# Patient Record
Sex: Male | Born: 1995 | Race: White | Hispanic: No | Marital: Single | Smoking: Former smoker
Health system: Southern US, Community
[De-identification: ages and names within clinical notes are randomized; demographics above are authoritative.]

## PROBLEM LIST (undated history)

## (undated) DIAGNOSIS — I2510 Atherosclerotic heart disease of native coronary artery without angina pectoris: Secondary | ICD-10-CM

## (undated) DIAGNOSIS — Z87891 Personal history of nicotine dependence: Secondary | ICD-10-CM

## (undated) DIAGNOSIS — Z9289 Personal history of other medical treatment: Secondary | ICD-10-CM

## (undated) DIAGNOSIS — E785 Hyperlipidemia, unspecified: Secondary | ICD-10-CM

## (undated) HISTORY — DX: Personal history of nicotine dependence: Z87.891

## (undated) HISTORY — DX: Hyperlipidemia, unspecified: E78.5

## (undated) HISTORY — DX: Personal history of other medical treatment: Z92.89

## (undated) HISTORY — DX: Atherosclerotic heart disease of native coronary artery without angina pectoris: I25.10

---
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
## 2015-12-05 ENCOUNTER — Emergency Department
Admission: EM | Admit: 2015-12-05 | Discharge: 2015-12-05 | Disposition: A | Discharge status: Home / Self Care | Payer: Self-pay | Attending: Emergency Medicine | Admitting: Emergency Medicine

## 2015-12-05 ENCOUNTER — Encounter: Payer: Self-pay | Admitting: Emergency Medicine

## 2015-12-05 DIAGNOSIS — K047 Periapical abscess without sinus: Secondary | ICD-10-CM | POA: Insufficient documentation

## 2015-12-05 DIAGNOSIS — F17210 Nicotine dependence, cigarettes, uncomplicated: Secondary | ICD-10-CM | POA: Insufficient documentation

## 2015-12-05 MED ORDER — HYDROCODONE-ACETAMINOPHEN 5-325 MG PO TABS: 1.0000 | ORAL_TABLET | ORAL | Status: DC | PRN

## 2015-12-05 MED ORDER — AMOXICILLIN 500 MG PO TABS: 500.0000 mg | ORAL_TABLET | Freq: Two times a day (BID) | ORAL | Status: DC

## 2015-12-05 MED ORDER — NAPROXEN 500 MG PO TABS: 500.0000 mg | ORAL_TABLET | Freq: Two times a day (BID) | ORAL | Status: DC

## 2015-12-05 NOTE — Discharge Instructions (Signed)
OPTIONS FOR DENTAL FOLLOW UP CARE ° °[place] Department of Health and Human Services - Local Safety Net Dental Clinics °http://www.ncdhhs.gov/dph/oralhealth/services/safetynetclinics.htm °  °[facility] (336-562-3123) ° °[facility] [place] (919-933-9087) ° °[facility] (919-663-1744 ext [phone]) ° °[facility] ([phone]) ° °[facility] ([phone]) °This clinic caters to the indigent population and is on a lottery system. °Location: °[facility], [address], [place] °Clinic Hours: °Wednesdays from 6pm - 9pm, patients seen by a lottery system. °For dates, call or go to [email or web address].[facility].edu/[facility]/patients/Dental-[facility] °Services: °Cleanings, fillings and simple extractions. °Payment Options: °DENTAL WORK IS FREE OF CHARGE. Bring proof of income or support. °Best way to get seen: °Arrive at 5:15 pm - this is a lottery, NOT first come/first serve, so arriving earlier will not increase your chances of being seen. °  °  °[facility] °[phone] °Select option 1 for emergencies °  °Location: °[facility], [address], [place] °Clinic Hours: °No walk-ins accepted - call the day before to schedule an appointment. °Check in times are 9:30 am and 1:30 pm. °Services: °Simple extractions, temporary fillings, pulpectomy/pulp debridement, uncomplicated abscess drainage. °Payment Options: °PAYMENT IS DUE AT THE TIME OF SERVICE.  Fee is usually $100-200, additional surgical procedures (e.g. abscess drainage) may be extra. °Cash, checks, Visa/MasterCard accepted.  Can file Medicaid if patient is covered for dental - patient should call case worker to check. °No discount for [facility] patients. °Best way to get seen: °MUST call the day before and get onto the schedule. Can usually be seen the next 1-2 days. No walk-ins accepted. °  °  °[facility] °[phone] °   °Location: °[facility], [address], [place] °Clinic Hours: °M, W, Th, F 8am or 1:30pm, Tues 9a or 1:30 - first come/first served. °Services: °Simple extractions, temporary fillings, uncomplicated abscess drainage.  You do not need to be an [place] resident. °Payment Options: °PAYMENT IS DUE AT THE TIME OF SERVICE. °Dental insurance, otherwise sliding scale - bring proof of income or support. °Depending on income and treatment needed, cost is usually $50-200. °Best way to get seen: °Arrive early as it is first come/first served. °  °  °[facility] °[phone] °  °Location: °[address] °Clinic Hours: °Mon-Thu 8a-5p °Services: °Most basic dental services including extractions and fillings. °Payment Options: °PAYMENT IS DUE AT THE TIME OF SERVICE. °Sliding scale, up to 50% off - bring proof if income or support. °Medicaid with dental option accepted. °Best way to get seen: °Call to schedule an appointment, can usually be seen within 2 weeks OR they will try to see walk-ins - show up at 8a or 2p (you may have to wait). °  °  °[facility] °[phone] °[place] RESIDENTS ONLY °  °Location: °[facility], [address], [place],  [address] °Clinic Hours: By appointment only. °Monday - Thursday 8am-5pm, Friday 8am-12pm °Services: Cleanings, fillings, extractions. °Payment Options: °PAYMENT IS DUE AT THE TIME OF SERVICE. °Cash, Visa or MasterCard. Sliding scale - $30 minimum per service. °Best way to get seen: °Come in to office, complete packet and make an appointment - need proof of income °or support monies for each household member and proof of [place] residence. °Usually takes about a month to get in. °  °  °[facility] °[phone] °  °Location: °[address]   [place] °Clinic Hours: Walk-in Urgent Care Dental Services are offered Monday-Friday  mornings only. °The numbers of emergencies accepted daily is limited to the number of °providers available. °Maximum 15 - Mondays, Wednesdays & Thursdays °Maximum 10 - Tuesdays & Fridays °Services: °You do not need to be a [place] resident to be seen for a dental emergency. °Emergencies are defined as pain, swelling, abnormal bleeding, or dental trauma. Walkins will receive x-rays if needed. °NOTE: Dental cleaning is not an emergency. °Payment Options: °PAYMENT IS DUE AT THE TIME OF SERVICE. °Minimum co-pay is $40.00 for uninsured patients. °Minimum co-pay is $3.00 for Medicaid with dental coverage. °Dental Insurance is accepted and must be presented at time of visit. °Medicare does not cover dental. °Forms of payment: Cash, credit card, checks. °Best way to get seen: °If not previously registered with the clinic, walk-in dental registration begins at 7:15 am and is on a first come/first serve basis. °If previously registered with the clinic, call to make an appointment. °  °  °[facility] °[phone] °[place] RESIDENTS ONLY °  °Location: °[address], [place], [place] °Clinic Hours: °Mon-Thu 10a-2p °Services: Extractions only! °Payment Options: °FREE (donations accepted) - bring proof of income or support °Best way to get seen: °Call and schedule an appointment OR come at 8am on the 1st Monday of every month (except for holidays) when it is first come/first served. °  °  °[facility] °[phone] °  °Location: °[address], [place] °Clinic Hours: °Friday mornings °Services, Payment Options, Best way to get seen: °Call for info ° ° °Dental Abscess °A dental abscess is a collection of pus in or around a tooth. °CAUSES °This condition is caused by a bacterial infection around the root of the tooth that involves the inner part of the tooth (pulp). It may result from: °· Severe tooth decay. °· Trauma to the tooth that allows bacteria to enter into the pulp, such as a broken or chipped  tooth. °· Severe gum disease around a tooth. °SYMPTOMS °Symptoms of this condition include: °· Severe pain in and around the infected tooth. °· Swelling and redness around the infected tooth, in the mouth, or in the face. °· Tenderness. °· Pus drainage. °· Bad breath. °· Bitter taste in the mouth. °· Difficulty swallowing. °· Difficulty opening the mouth. °· Nausea. °· Vomiting. °· Chills. °· Swollen neck glands. °· Fever. °DIAGNOSIS °This condition is diagnosed with examination of the infected tooth. During the exam, your dentist may tap on the infected tooth. Your dentist will also ask about your medical and dental history and may order X-rays. °TREATMENT °This condition is treated by eliminating the infection. This may be done with: °· Antibiotic medicine. °· A root canal. This may be performed to save the tooth. °· Pulling (extracting) the tooth. This may also involve draining the abscess. This is done if the tooth cannot be saved. °HOME CARE INSTRUCTIONS °· Take medicines only as directed by your dentist. °· If you were prescribed antibiotic medicine, finish all of it even if you start to feel better. °· Rinse your mouth (gargle) often with salt water to relieve pain or swelling. °· Do not drive or operate heavy machinery while taking pain medicine. °· Do not apply heat to the outside of your mouth. °· Keep all follow-up visits as directed by your dentist. This is important. °SEEK MEDICAL CARE IF: °· Your pain is worse and is not helped by medicine. °SEEK IMMEDIATE MEDICAL CARE IF: °·   You have a fever or chills. °· Your symptoms suddenly get worse. °· You have a very bad headache. °· You have problems breathing or swallowing. °· You have trouble opening your mouth. °· You have swelling in your neck or around your eye. °  °This information is not intended to replace advice given to you by your health care provider. Make sure you discuss any questions you have with your health care provider. °  °Document  Released: 06/18/2005 Document Revised: 11/02/2014 Document Reviewed: 06/15/2014 °Elsevier Interactive Patient Education ©2016 Elsevier Inc. ° °

## 2015-12-05 NOTE — ED Notes (Signed)
Dental pain L side of mouth, unsure if it is upper or lower, since last night.

## 2015-12-05 NOTE — ED Provider Notes (Signed)
[facility] Emergency Department Provider Note  ____________________________________________  Time seen: Approximately 10:54 AM  I have reviewed the triage vital signs and the nursing notes.   HISTORY  Chief Complaint Dental Pain    HPI Walter Duncan is a 20 y.o. male who presents for evaluation of left side of the mouth pain times a couple days. Denies any trauma. Positive swelling. Patient complains of dental pain describes as a 9/10 nonradiating. No relief with over-the-counter medications.   History reviewed. No pertinent past medical history.  There are no active problems to display for this patient.   History reviewed. No pertinent past surgical history.  Current Outpatient Rx  Name  Route  Sig  Dispense  Refill  . amoxicillin (AMOXIL) 500 MG tablet   Oral   Take 1 tablet (500 mg total) by mouth 2 (two) times daily.   20 tablet   0   . HYDROcodone-acetaminophen (NORCO) 5-325 MG tablet   Oral   Take 1-2 tablets by mouth every 4 (four) hours as needed for moderate pain.   15 tablet   0   . naproxen (NAPROSYN) 500 MG tablet   Oral   Take 1 tablet (500 mg total) by mouth 2 (two) times daily with a meal.   60 tablet   0     Allergies Review of patient's allergies indicates no known allergies.  No family history on file.  Social History Social History  Substance Use Topics  . Smoking status: Current Every Day Smoker -- 0.50 packs/day    Types: Cigarettes  . Smokeless tobacco: None  . Alcohol Use: No    Review of Systems Constitutional: No fever/chills Eyes: No visual changes. ENT: Positive dental caries noted both posterior lower left and upper left. Positive erythema and swelling noted to the gums. Cardiovascular: Denies chest pain. Respiratory: Denies shortness of breath. Musculoskeletal: Negative for back pain. Skin: Negative for rash. Neurological: Negative for headaches, focal weakness or numbness.  10-point ROS  otherwise negative.  ____________________________________________   PHYSICAL EXAM:  VITAL SIGNS: ED Triage Vitals  Enc Vitals Group     BP 12/05/15 1008 107/76 mmHg     Pulse Rate 12/05/15 1008 66     Resp 12/05/15 1008 18     Temp 12/05/15 1008 98.3 F (36.8 C)     Temp Source 12/05/15 1008 Oral     SpO2 12/05/15 1008 97 %     Weight 12/05/15 1008 126 lb (57.153 kg)     Height 12/05/15 1008 5\' 5"  (1.651 m)     Head Cir --      Peak Flow --      Pain Score 12/05/15 1010 9     Pain Loc --      Pain Edu? --      Excl. in GC? --     Constitutional: Alert and oriented. Well appearing and in no acute distress. Mouth/Throat: Mucous membranes are moist.  Oropharynx non-erythematous.Obvious dental caries left upper and lower teeth. Gums are erythematous Neck: No stridor.   Cardiovascular: Normal rate, regular rhythm. Grossly normal heart sounds.  Good peripheral circulation. Respiratory: Normal respiratory effort.  No retractions. Lungs CTAB. Musculoskeletal: No lower extremity tenderness nor edema.  No joint effusions. Neurologic:  Normal speech and language. No gross focal neurologic deficits are appreciated. No gait instability. Skin:  Skin is warm, dry and intact. No rash noted. Psychiatric: Mood and affect are normal. Speech and behavior are normal.  ____________________________________________  LABS (all labs ordered are listed, but only abnormal results are displayed)  Labs Reviewed - No data to display ____________________________________________    PROCEDURES  Procedure(s) performed: None  Critical Care performed: No  ____________________________________________   INITIAL IMPRESSION / ASSESSMENT AND PLAN / ED COURSE  Pertinent labs & imaging results that were available during my care of the patient were reviewed by me and considered in my medical decision making (see chart for details).  Acute dental abscess. Rx given for amoxicillin 500 mg 3 times a  day, Naprosyn 500 mg twice a day and Norco 5/325. Patient follow-up with list attached dental providers. He voices no other emergency medical complaints at this time. ____________________________________________   FINAL CLINICAL IMPRESSION(S) / ED DIAGNOSES  Final diagnoses:  Dental abscess     This chart was dictated using voice recognition software/Dragon. Despite best efforts to proofread, errors can occur which can change the meaning. Any change was purely unintentional.   Evangeline Dakin, PA-C 12/05/15 1606  Jene Every, MD 12/06/15 1253

## 2015-12-05 NOTE — ED Notes (Signed)
States he developed some pain to left side of mouth couple of days ago.denies any trauma   No swelling noted   Min relief with ibu

## 2015-12-11 ENCOUNTER — Emergency Department
Admission: EM | Admit: 2015-12-11 | Discharge: 2015-12-11 | Disposition: A | Discharge status: Home / Self Care | Payer: Self-pay | Attending: Emergency Medicine | Admitting: Emergency Medicine

## 2015-12-11 ENCOUNTER — Encounter: Payer: Self-pay | Admitting: Emergency Medicine

## 2015-12-11 DIAGNOSIS — F17210 Nicotine dependence, cigarettes, uncomplicated: Secondary | ICD-10-CM | POA: Insufficient documentation

## 2015-12-11 DIAGNOSIS — B0230 Zoster ocular disease, unspecified: Secondary | ICD-10-CM | POA: Insufficient documentation

## 2015-12-11 MED ORDER — HYDROCODONE-ACETAMINOPHEN 5-325 MG PO TABS
1.0000 | ORAL_TABLET | ORAL | Status: AC
  Administered 2015-12-11: 1 via ORAL
  Filled 2015-12-11: qty 1

## 2015-12-11 MED ORDER — TETRACAINE HCL 0.5 % OP SOLN
1.0000 [drp] | Freq: Once | OPHTHALMIC | Status: AC
  Administered 2015-12-11: 1 [drp] via OPHTHALMIC
  Filled 2015-12-11: qty 2

## 2015-12-11 MED ORDER — HYDROCODONE-ACETAMINOPHEN 5-325 MG PO TABS: 1.0000 | ORAL_TABLET | Freq: Four times a day (QID) | ORAL | Status: DC | PRN

## 2015-12-11 MED ORDER — ACYCLOVIR 400 MG PO TABS: 800.0000 mg | ORAL_TABLET | Freq: Every day | ORAL | Status: DC

## 2015-12-11 MED ORDER — ACYCLOVIR 200 MG PO CAPS
800.0000 mg | ORAL_CAPSULE | Freq: Every day | ORAL | Status: DC
Start: 2015-12-11 — End: 2015-12-12
  Administered 2015-12-11: 800 mg via ORAL
  Filled 2015-12-11 (×2): qty 4

## 2015-12-11 MED ORDER — FLUORESCEIN SODIUM 1 MG OP STRP
1.0000 | ORAL_STRIP | Freq: Once | OPHTHALMIC | Status: AC
  Administered 2015-12-11: 1 via OPHTHALMIC
  Filled 2015-12-11: qty 1

## 2015-12-11 NOTE — Discharge Instructions (Signed)
Shingles Shingles is an infection that causes a painful skin rash and fluid-filled blisters. Shingles is caused by the same virus that causes chickenpox. Shingles only develops in people who:  Have had chickenpox.  Have gotten the chickenpox vaccine. (This is rare.) The first symptoms of shingles may be itching, tingling, or pain in an area on your skin. A rash will follow in a few days or weeks. The rash is usually on one side of the body in a bandlike or beltlike pattern. Over time, the rash turns into fluid-filled blisters that break open, scab over, and dry up. Medicines may:  Help you manage pain.  Help you recover more quickly.  Help to prevent long-term problems. HOME CARE Medicines  Take medicines only as told by your doctor.  Apply an anti-itch or numbing cream to the affected area as told by your doctor. Blister and Rash Care  Take a cool bath or put cool compresses on the area of the rash or blisters as told by your doctor. This may help with pain and itching.  Keep your rash covered with a loose bandage (dressing). Wear loose-fitting clothing.  Keep your rash and blisters clean with mild soap and cool water or as told by your doctor.  Check your rash every day for signs of infection. These include redness, swelling, and pain that lasts or gets worse.  Do not pick your blisters.  Do not scratch your rash. General Instructions  Rest as told by your doctor.  Keep all follow-up visits as told by your doctor. This is important.  Until your blisters scab over, your infection can cause chickenpox in people who have never had it or been vaccinated against it. To prevent this from happening, avoid touching other people or being around other people, especially:  Babies.  Pregnant women.  Children who have eczema.  Elderly people who have transplants.  People who have chronic illnesses, such as leukemia or AIDS. GET HELP IF:  Your pain does not get better with  medicine.  Your pain does not get better after the rash heals.  Your rash looks infected. Signs of infection include:  Redness.  Swelling.  Pain that lasts or gets worse. GET HELP RIGHT AWAY IF:  The rash is on your face or nose.  You have pain in your face, pain around your eye area, or loss of feeling on one side of your face.  You have ear pain or you have ringing in your ear.  You have loss of taste.  Your condition gets worse.   This information is not intended to replace advice given to you by your health care provider. Make sure you discuss any questions you have with your health care provider.   Document Released: 12/05/2007 Document Revised: 07/09/2014 Document Reviewed: 03/30/2014 Elsevier Interactive Patient Education 2016 Elsevier Inc.   Please call Dr. Skip EstimableBrasington's office first thing tomorrow morning. Dr. Inez PilgrimBrasington will need to see you in his office tomorrow morning to examine your eye.

## 2015-12-11 NOTE — ED Notes (Signed)
Believes he got poison oak around his rt eye - tried putting cream around the eye with no relief - now eye red as well

## 2015-12-11 NOTE — ED Provider Notes (Signed)
CSN: [id number]     Arrival date & time 12/11/15  2049 History   First MD Initiated Contact with Patient 12/11/15 2130     Chief Complaint  Patient presents with  . Eye Pain     (Consider location/radiation/quality/duration/timing/severity/associated sxs/prior Treatment) HPI  20 year old male presents of her [place] for evaluation of right eye pain with rash. Symptoms have been present for 4 days. He developed papular painful rash along the right side of his face above the right eye. Today he developed blurred vision with photophobia that is mild. His pain is 10 out of 10. His employer told him that he had developed a contact dermatitis, likely poison ivy, topical Benadryl has not provided him with any relief. He has not been taking any medications for pain. He denies any other rash to his body. No fevers or neck pain.  History reviewed. No pertinent past medical history. History reviewed. No pertinent past surgical history. History reviewed. No pertinent family history. Social History  Substance Use Topics  . Smoking status: Current Every Day Smoker -- 0.50 packs/day    Types: Cigarettes  . Smokeless tobacco: None  . Alcohol Use: No    Review of Systems  Constitutional: Negative.  Negative for fever, chills, activity change and appetite change.  HENT: Negative for congestion, ear pain, mouth sores, rhinorrhea, sinus pressure, sore throat and trouble swallowing.   Eyes: Positive for photophobia, pain and redness. Negative for discharge.  Respiratory: Negative for cough, chest tightness and shortness of breath.   Cardiovascular: Negative for chest pain and leg swelling.  Gastrointestinal: Negative for nausea, vomiting, abdominal pain, diarrhea and abdominal distention.  Genitourinary: Negative for dysuria and difficulty urinating.  Musculoskeletal: Negative for back pain, arthralgias and gait problem.  Skin: Positive for rash. Negative for color change.  Neurological: Negative for  dizziness and headaches.  Hematological: Negative for adenopathy.  Psychiatric/Behavioral: Negative for behavioral problems and agitation.      Allergies  Review of patient's allergies indicates no known allergies.  Home Medications   Prior to Admission medications   Medication Sig Start Date End Date Taking? Authorizing Provider  acyclovir (ZOVIRAX) 400 MG tablet Take 2 tablets (800 mg total) by mouth 5 (five) times daily. X 10 days 12/11/15   Evon Slackhomas C Gaines, PA-C  amoxicillin (AMOXIL) 500 MG tablet Take 1 tablet (500 mg total) by mouth 2 (two) times daily. 12/05/15   Evangeline Dakinharles M Beers, PA-C  HYDROcodone-acetaminophen (NORCO) 5-325 MG tablet Take 1 tablet by mouth every 6 (six) hours as needed for moderate pain. 12/11/15   Evon Slackhomas C Gaines, PA-C  naproxen (NAPROSYN) 500 MG tablet Take 1 tablet (500 mg total) by mouth 2 (two) times daily with a meal. 12/05/15   Charmayne Sheerharles M Beers, PA-C   BP 112/68 mmHg  Pulse 98  Temp(Src) 98.5 F (36.9 C) (Oral)  Resp 20  Ht 5\' 5"  (1.651 m)  Wt 57.607 kg  BMI 21.13 kg/m2  SpO2 98% Physical Exam  Constitutional: He is oriented to person, place, and time. He appears well-developed and well-nourished.  HENT:  Head: Normocephalic and atraumatic.  Eyes: EOM and lids are normal. Pupils are equal, round, and reactive to light. Lids are everted and swept, no foreign bodies found. Right conjunctiva is injected.  Slit lamp exam:      The right eye shows no fluorescein uptake and no anterior chamber bulge.  Examination of the right face shows patient has a macular papular rash in the ophthalmic nerve distribution that does  not cross the midline. There is mild right eye conjunctival erythema with no drainage. There is no eyelid droop. Extraocular movement is normal.  Neck: Normal range of motion. Neck supple.  Cardiovascular: Normal rate, regular rhythm, normal heart sounds and intact distal pulses.   Pulmonary/Chest: Effort normal and breath sounds normal. No  respiratory distress. He has no wheezes. He has no rales. He exhibits no tenderness.  Abdominal: Soft. Bowel sounds are normal. He exhibits no distension. There is no tenderness.  Musculoskeletal: Normal range of motion. He exhibits no edema or tenderness.  Neurological: He is alert and oriented to person, place, and time.  Skin: Skin is warm and dry.  Psychiatric: He has a normal mood and affect. His behavior is normal. Judgment and thought content normal.    ED Course  Procedures (including critical care time) Labs Review Labs Reviewed - No data to display  Imaging Review No results found. I have personally reviewed and evaluated these images and lab results as part of my medical decision-making.   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 Visual acuity 20/20 both eyes. Discussed case with Dr. Inez Pilgrim. Patient will be started on oral Acyclovir   5 times daily for 10 days. He is going to follow-up with Dr. Inez Pilgrim first thing in the morning for evaluation. Patient return to the ER for any worsening symptoms or urgent changes in his health.    Evon Slack, PA-C 12/11/15 2218  Evon Slack, PA-C 12/11/15 2219  Jene Every, MD 12/11/15 [phone]

## 2016-09-25 ENCOUNTER — Encounter (HOSPITAL_COMMUNITY): Payer: Self-pay | Admitting: *Deleted

## 2016-09-25 ENCOUNTER — Ambulatory Visit (HOSPITAL_COMMUNITY)
Admission: EM | Admit: 2016-09-25 | Discharge: 2016-09-25 | Disposition: A | Discharge status: Home / Self Care | Payer: Self-pay | Attending: Internal Medicine | Admitting: Internal Medicine

## 2016-09-25 DIAGNOSIS — K529 Noninfective gastroenteritis and colitis, unspecified: Secondary | ICD-10-CM

## 2016-09-25 MED ORDER — ONDANSETRON 8 MG PO TBDP: 8.0000 mg | ORAL_TABLET | Freq: Three times a day (TID) | ORAL | 0 refills | Status: DC | PRN

## 2016-09-25 NOTE — ED Triage Notes (Signed)
Vomiting    And   Diarrhea   Started  Last  Pm      denys  Any  Other  Symptoms

## 2016-09-25 NOTE — ED Provider Notes (Signed)
CSN: [id number]     Arrival date & time 09/25/16  1141 History   None    Chief Complaint  Patient presents with  . Emesis   (Consider location/radiation/quality/duration/timing/severity/associated sxs/prior Treatment) Patient c/o NVD since last night.   The history is provided by the patient.  Emesis  Severity:  Moderate Duration:  2 days Timing:  Constant Able to tolerate:  Liquids Progression:  Worsening Chronicity:  New Relieved by:  Nothing Worsened by:  Nothing Ineffective treatments:  None tried   History reviewed. No pertinent past medical history. History reviewed. No pertinent surgical history. History reviewed. No pertinent family history. Social History  Substance Use Topics  . Smoking status: Current Every Day Smoker    Packs/day: 0.50    Types: Cigarettes  . Smokeless tobacco: Not on file  . Alcohol use No    Review of Systems  Constitutional: Positive for fatigue.  HENT: Negative.   Eyes: Negative.   Respiratory: Negative.   Cardiovascular: Negative.   Gastrointestinal: Positive for vomiting.  Endocrine: Negative.   Genitourinary: Negative.   Musculoskeletal: Negative.   Skin: Negative.   Allergic/Immunologic: Negative.   Neurological: Negative.   Hematological: Negative.   Psychiatric/Behavioral: Negative.     Allergies  Patient has no known allergies.  Home Medications   Prior to Admission medications   Medication Sig Start Date End Date Taking? Authorizing Provider  acyclovir (ZOVIRAX) 400 MG tablet Take 2 tablets (800 mg total) by mouth 5 (five) times daily. X 10 days 12/11/15   Evon Slack, PA-C  amoxicillin (AMOXIL) 500 MG tablet Take 1 tablet (500 mg total) by mouth 2 (two) times daily. 12/05/15   Evangeline Dakin, PA-C  HYDROcodone-acetaminophen (NORCO) 5-325 MG tablet Take 1 tablet by mouth every 6 (six) hours as needed for moderate pain. 12/11/15   Evon Slack, PA-C  naproxen (NAPROSYN) 500 MG tablet Take 1 tablet (500 mg  total) by mouth 2 (two) times daily with a meal. 12/05/15   Charmayne Sheer Beers, PA-C  ondansetron (ZOFRAN ODT) 8 MG disintegrating tablet Take 1 tablet (8 mg total) by mouth every 8 (eight) hours as needed for nausea or vomiting. 09/25/16   Deatra Canter, FNP   Meds Ordered and Administered this Visit  Medications - No data to display  BP 121/69 (BP Location: Right Arm)   Pulse 78   Temp 98.4 F (36.9 C)   Resp 18   SpO2 100%  No data found.   Physical Exam  Constitutional: He appears well-developed and well-nourished.  HENT:  Head: Normocephalic and atraumatic.  Eyes: Conjunctivae and EOM are normal. Pupils are equal, round, and reactive to light.  Neck: Normal range of motion. Neck supple.  Cardiovascular: Normal rate, regular rhythm and normal heart sounds.   Pulmonary/Chest: Effort normal and breath sounds normal.  Abdominal: Soft. Bowel sounds are normal.  Nursing note and vitals reviewed.   Urgent Care Course     Procedures (including critical care time)  Labs Review Labs Reviewed - No data to display  Imaging Review No results found.   Visual Acuity Review  Right Eye Distance:   Left Eye Distance:   Bilateral Distance:    Right Eye Near:   Left Eye Near:    Bilateral Near:         MDM   1. Gastroenteritis    Zofran ODT 8mg  one po tid prn #21  Push po fluids, rest, tylenol and motrin otc prn as directed for fever,  arthralgias, and myalgias.  Follow up prn if sx's continue or persist.    Deatra CanterWilliam J Oxford, FNP 09/25/16 1334

## 2018-05-19 ENCOUNTER — Encounter: Payer: Self-pay | Admitting: Emergency Medicine

## 2018-05-19 ENCOUNTER — Emergency Department: Payer: PRIVATE HEALTH INSURANCE

## 2018-05-19 ENCOUNTER — Emergency Department
Admission: EM | Admit: 2018-05-19 | Discharge: 2018-05-19 | Disposition: A | Discharge status: Home / Self Care | Payer: PRIVATE HEALTH INSURANCE | Attending: Emergency Medicine | Admitting: Emergency Medicine

## 2018-05-19 DIAGNOSIS — F17210 Nicotine dependence, cigarettes, uncomplicated: Secondary | ICD-10-CM | POA: Insufficient documentation

## 2018-05-19 DIAGNOSIS — Y93H2 Activity, gardening and landscaping: Secondary | ICD-10-CM | POA: Insufficient documentation

## 2018-05-19 DIAGNOSIS — S99922A Unspecified injury of left foot, initial encounter: Secondary | ICD-10-CM | POA: Diagnosis not present

## 2018-05-19 DIAGNOSIS — Y929 Unspecified place or not applicable: Secondary | ICD-10-CM | POA: Insufficient documentation

## 2018-05-19 DIAGNOSIS — Y998 Other external cause status: Secondary | ICD-10-CM | POA: Diagnosis not present

## 2018-05-19 MED ORDER — IBUPROFEN 600 MG PO TABS: 600.0000 mg | ORAL_TABLET | Freq: Four times a day (QID) | ORAL | 0 refills | Status: DC | PRN
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
## 2018-05-19 NOTE — ED Triage Notes (Signed)
Spoke with Walter Duncan [phone], required UDS for WC
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
## 2019-04-18 IMAGING — DX DG FOOT COMPLETE 3+V*L*
3 series · 3 of 3 positions shown · non-contrast
Comparison: None.

CLINICAL DATA: Left foot pain

EXAM:
LEFT FOOT - COMPLETE 3+ VIEW

[foot ap]
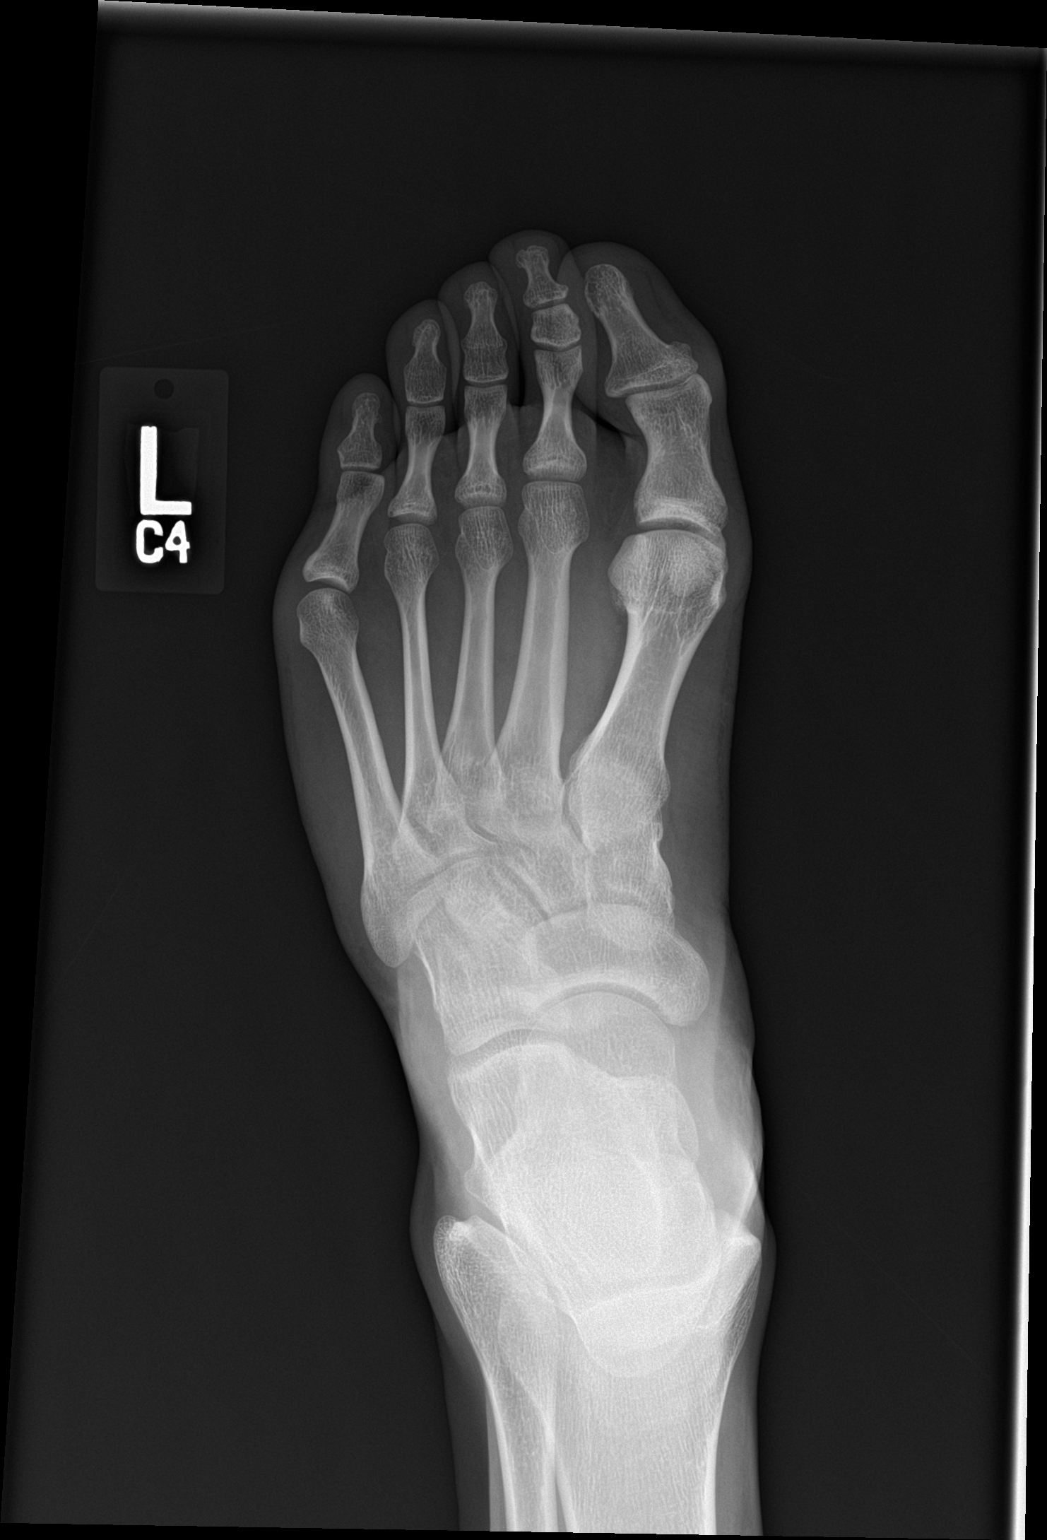

[foot obl]
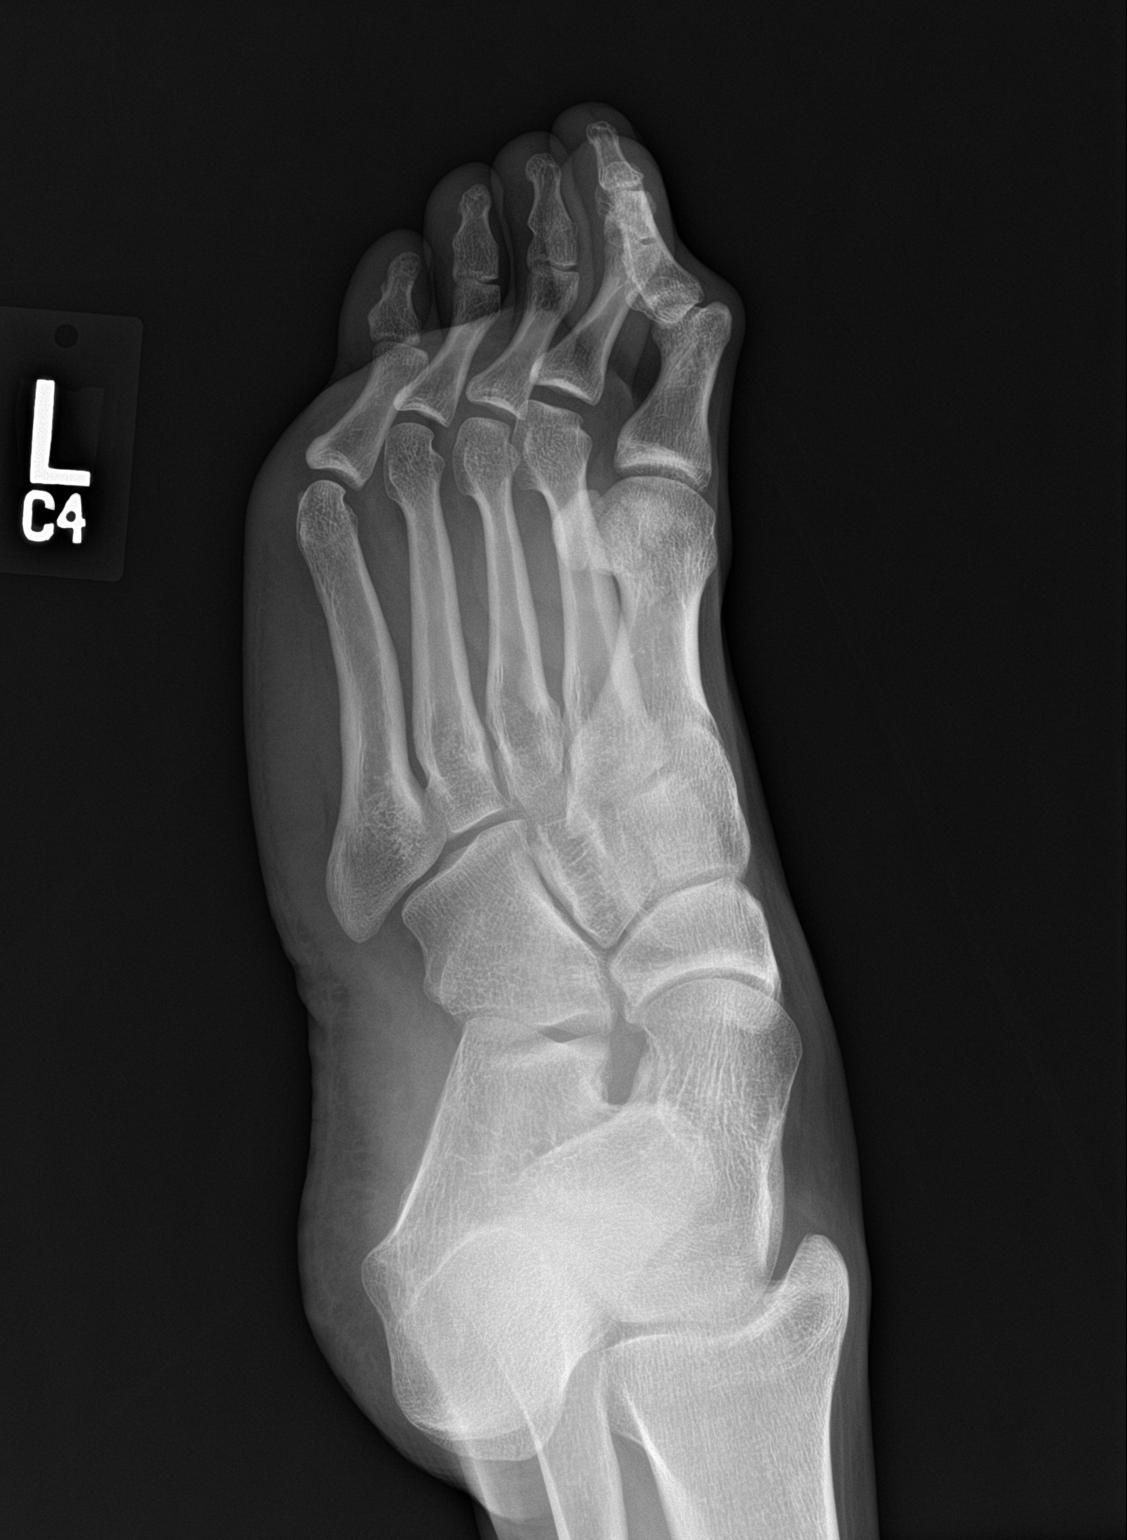

[foot lat]
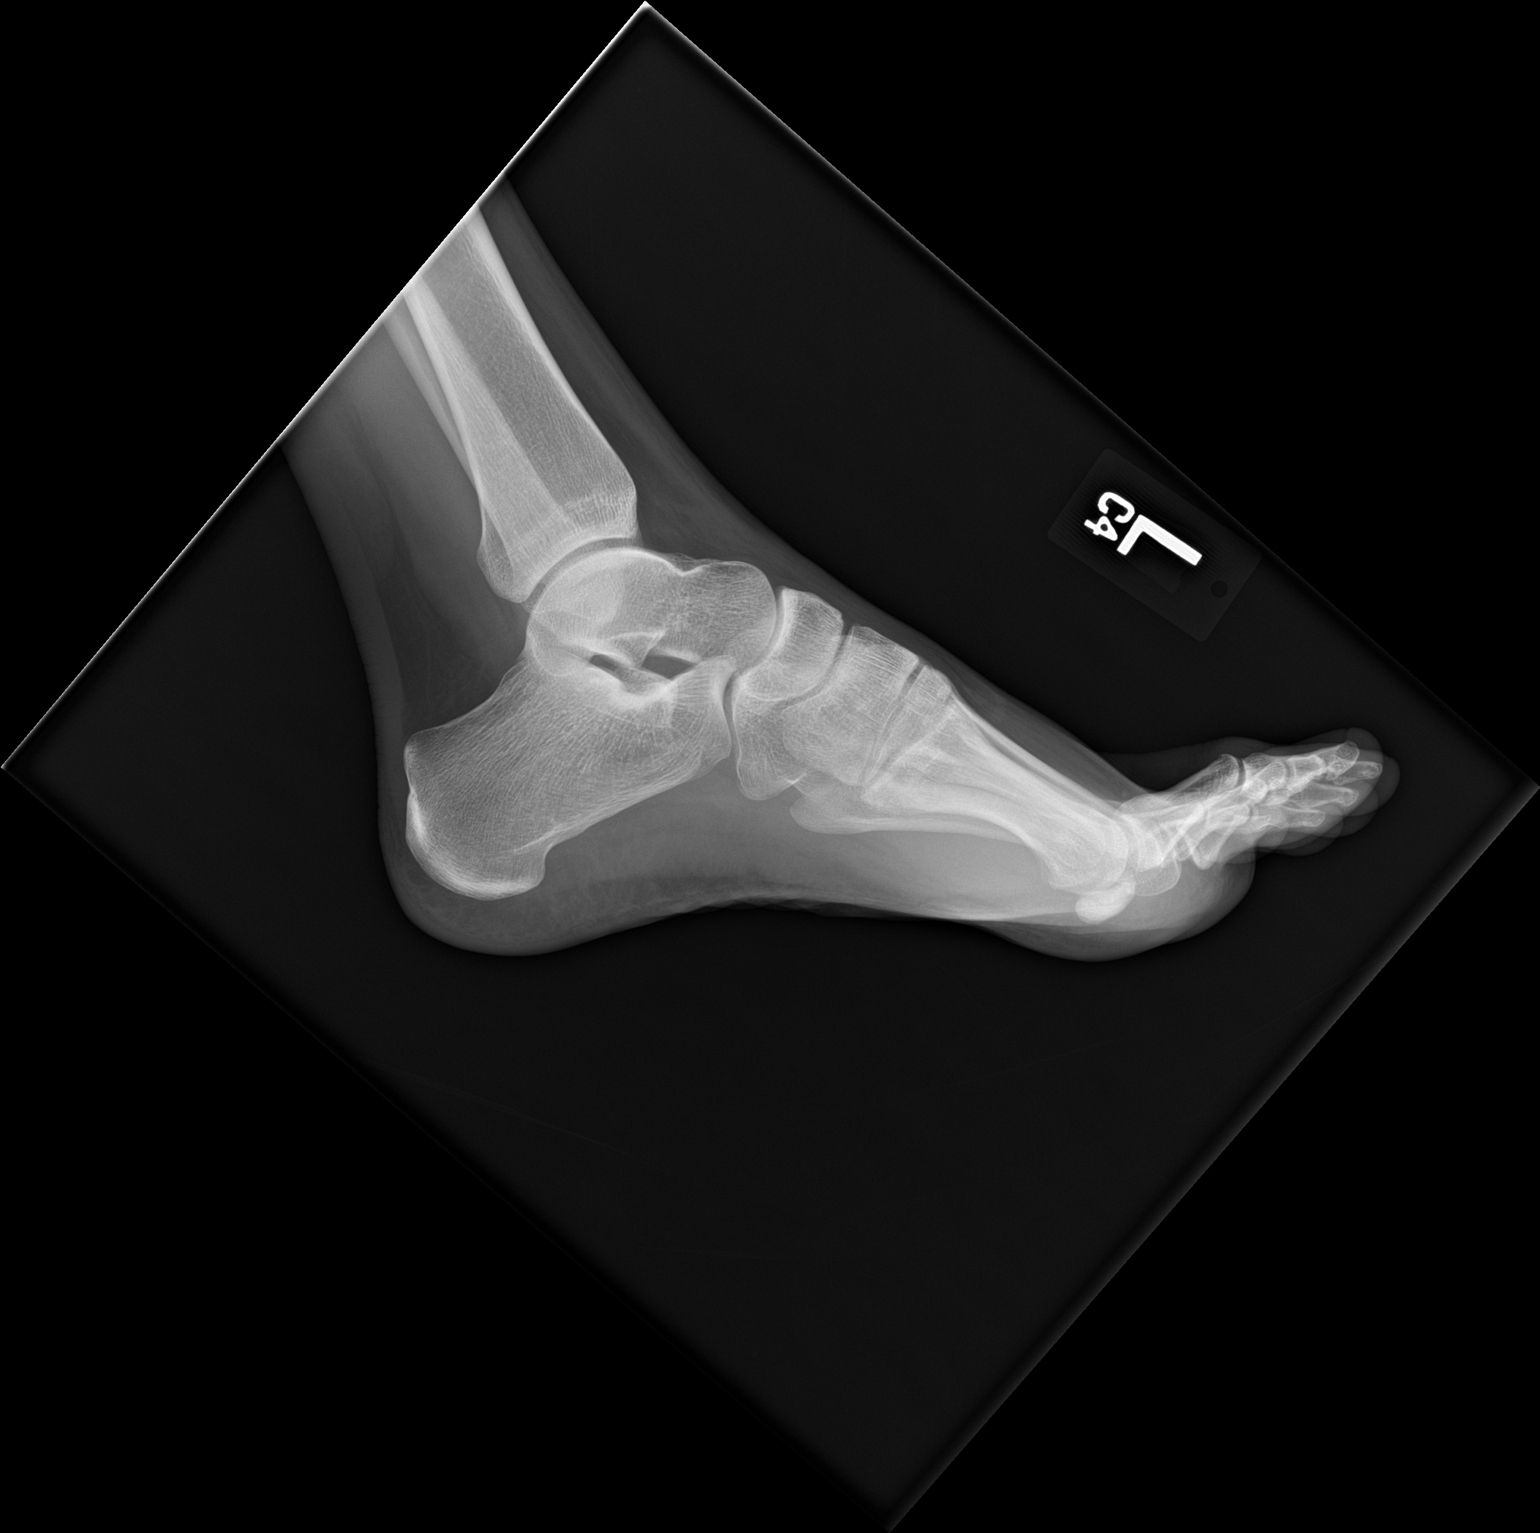

[3 of 3 positions shown; findings below may reference images not displayed]

FINDINGS: There is no evidence of fracture or dislocation. There is no
evidence of arthropathy or other focal bone abnormality. Soft
tissues are unremarkable.
IMPRESSION: Negative.

## 2022-01-24 ENCOUNTER — Emergency Department
Admission: EM | Admit: 2022-01-24 | Discharge: 2022-01-24 | Disposition: A | Discharge status: Home / Self Care | Payer: Worker's Compensation | Attending: Emergency Medicine | Admitting: Emergency Medicine

## 2022-01-24 ENCOUNTER — Other Ambulatory Visit: Payer: Self-pay

## 2022-01-24 DIAGNOSIS — T675XXA Heat exhaustion, unspecified, initial encounter: Secondary | ICD-10-CM | POA: Diagnosis present

## 2022-01-24 LAB — COMPREHENSIVE METABOLIC PANEL
ALT: 23 U/L (ref 0–44)
AST: 19 U/L (ref 15–41)
Albumin: 4.7 g/dL (ref 3.5–5.0)
Alkaline Phosphatase: 59 U/L (ref 38–126)
Anion gap: 9 (ref 5–15)
BUN: 15 mg/dL (ref 6–20)
CO2: 24 mmol/L (ref 22–32)
Calcium: 10.1 mg/dL (ref 8.9–10.3)
Chloride: 104 mmol/L (ref 98–111)
Creatinine, Ser: 0.96 mg/dL (ref 0.61–1.24)
GFR, Estimated: >60 mL/min (ref >60)
Glucose, Bld: 84 mg/dL (ref 70–99)
Potassium: 3.9 mmol/L (ref 3.5–5.1)
Sodium: 137 mmol/L (ref 135–145)
Total Bilirubin: 0.9 mg/dL (ref 0.3–1.2)
Total Protein: 7.6 g/dL (ref 6.5–8.1)

## 2022-01-24 LAB — CBC
HCT: 44.4 % (ref 39.0–52.0)
Hemoglobin: 14.8 g/dL (ref 13.0–17.0)
MCH: 29 pg (ref 26.0–34.0)
MCHC: 33.3 g/dL (ref 30.0–36.0)
MCV: 86.9 fL (ref 80.0–100.0)
Platelets: 239 10*3/uL (ref 150–400)
RBC: 5.11 MIL/uL (ref 4.22–5.81)
RDW: 11.9 % (ref 11.5–15.5)
WBC: 8.2 10*3/uL (ref 4.0–10.5)
nRBC: 0 % (ref 0.0–0.2)

## 2022-01-24 MED ORDER — SODIUM CHLORIDE 0.9 % IV BOLUS
1000.0000 mL | Freq: Once | INTRAVENOUS | Status: AC
  Administered 2022-01-24: 1000 mL via INTRAVENOUS

## 2022-01-24 NOTE — ED Notes (Signed)
Was at work and got heat exaustion.  Weak , vomited.  Person from his employer says he looks much better now.  This is WC and will need uds.

## 2022-01-24 NOTE — ED Provider Notes (Signed)
[facility] Provider Note  Patient Contact: 3:57 PM (approximate)   History   Heat Exposure   HPI  Walter Duncan is a 26 y.o. male who presents the emergency department from work for complaint of heat exhaustion.  Patient states that he been outside all day, started to get lightheaded, dizzy, feeling like he is going to pass out.  Patient stated that he started feeling exceptionally high and had decreased sweating.  Patient has felt better after receiving IV fluids and being in the air conditioning.  No associated headache, visual changes, chest pain, shortness of breath, emesis     Physical Exam   Triage Vital Signs: ED Triage Vitals  Enc Vitals Group     BP 01/24/22 1411 119/60     Pulse Rate 01/24/22 1411 70     Resp 01/24/22 1411 18     Temp 01/24/22 1411 97.9 F (36.6 C)     Temp Source 01/24/22 1411 Oral     SpO2 01/24/22 1411 97 %     Weight 01/24/22 1412 145 lb (65.8 kg)     Height 01/24/22 1412 5\' 5"  (1.651 m)     Head Circumference --      Peak Flow --      Pain Score 01/24/22 1412 0     Pain Loc --      Pain Edu? --      Excl. in GC? --     Most recent vital signs: Vitals:   01/24/22 1411  BP: 119/60  Pulse: 70  Resp: 18  Temp: 97.9 F (36.6 C)  SpO2: 97%     General: Alert and in no acute distress.  Cardiovascular:  Good peripheral perfusion Respiratory: Normal respiratory effort without tachypnea or retractions. Lungs CTAB.  Musculoskeletal: Full range of motion to all extremities.  Neurologic:  No gross focal neurologic deficits are appreciated.  Skin:   No rash noted Other:   ED Results / Procedures / Treatments   Labs (all labs ordered are listed, but only abnormal results are displayed) Labs Reviewed  CBC  COMPREHENSIVE METABOLIC PANEL     EKG   ED ECG REPORT I, 01/26/22 Gaven Eugene,  personally viewed and interpreted this ECG.   Date: 01/24/2022  EKG Time: 1409 hrs.  Rate: 80 bpm  Rhythm: there  are no previous tracings available for comparison, normal sinus rhythm, sinus arrhythmia.  No previous EKG for comparison  Axis: Normal axis  Intervals:none  ST&T Change: No gross ST elevation or depression noted.  Inverted T wave lead III  Normal sinus rhythm.  No previous EKG for comparison.  Sinus arrhythmia.  No STEMI.   RADIOLOGY    No results found.  PROCEDURES:  Critical Care performed: No  Procedures   MEDICATIONS ORDERED IN ED: Medications  sodium chloride 0.9 % bolus 1,000 mL (0 mLs Intravenous Stopped 01/24/22 1600)  sodium chloride 0.9 % bolus 1,000 mL (1,000 mLs Intravenous New Bag/Given 01/24/22 1607)     IMPRESSION / MDM / ASSESSMENT AND PLAN / ED COURSE  I reviewed the triage vital signs and the nursing notes.                              Differential diagnosis includes, but is not limited to, heat exhaustion, heat stroke, dehydration, hypokalemia, hyponatremia  Patient's presentation is most consistent with acute presentation with potential threat to life or bodily function.  Patient's diagnosis is consistent with heat exhaustion.  Patient presents to the ED after feeling the effects of being out in the heat all day.  Patient was drinking water but had not had any electrolytes.  Patient has a reassuring sodium and potassium, exam is reassuring.  EKG is reassuring.  Patient received IV fluids and feels back to his baseline.  He is stable for discharge at this time.  Follow-up primary care as needed..  Patient is given ED precautions to return to the ED for any worsening or new symptoms.        FINAL CLINICAL IMPRESSION(S) / ED DIAGNOSES   Final diagnoses:  Heat exhaustion, initial encounter     Rx / DC Orders   ED Discharge Orders     None        Note:  This document was prepared using Dragon voice recognition software and may include unintentional dictation errors.   Racheal Patches, PA-C 01/24/22 1657    Minna Antis,  MD 01/24/22 1932

## 2022-01-24 NOTE — ED Triage Notes (Signed)
Pt here with heat exhaustion. Pt was outside working and sat down and starred vomiting. Pt states he has been consuming water.   [facility] [place]

## 2022-02-01 ENCOUNTER — Encounter: Payer: Self-pay | Admitting: Emergency Medicine

## 2022-02-01 ENCOUNTER — Other Ambulatory Visit: Payer: Self-pay

## 2022-02-01 ENCOUNTER — Emergency Department: Payer: Medicaid Other

## 2022-02-01 ENCOUNTER — Emergency Department
Admission: EM | Admit: 2022-02-01 | Discharge: 2022-02-01 | Disposition: A | Discharge status: Home / Self Care | Payer: Medicaid Other | Attending: Emergency Medicine | Admitting: Emergency Medicine

## 2022-02-01 DIAGNOSIS — K219 Gastro-esophageal reflux disease without esophagitis: Secondary | ICD-10-CM

## 2022-02-01 DIAGNOSIS — R079 Chest pain, unspecified: Secondary | ICD-10-CM

## 2022-02-01 DIAGNOSIS — K2100 Gastro-esophageal reflux disease with esophagitis, without bleeding: Secondary | ICD-10-CM | POA: Insufficient documentation

## 2022-02-01 LAB — HEPATIC FUNCTION PANEL
ALT: 39 U/L (ref 0–44)
AST: 32 U/L (ref 15–41)
Albumin: 4.1 g/dL (ref 3.5–5.0)
Alkaline Phosphatase: 65 U/L (ref 38–126)
Bilirubin, Direct: <0.1 mg/dL (ref 0.0–0.2)
Total Bilirubin: 0.7 mg/dL (ref 0.3–1.2)
Total Protein: 7.1 g/dL (ref 6.5–8.1)

## 2022-02-01 LAB — BASIC METABOLIC PANEL
Anion gap: 6 (ref 5–15)
BUN: 17 mg/dL (ref 6–20)
CO2: 24 mmol/L (ref 22–32)
Calcium: 9.2 mg/dL (ref 8.9–10.3)
Chloride: 107 mmol/L (ref 98–111)
Creatinine, Ser: 0.83 mg/dL (ref 0.61–1.24)
GFR, Estimated: >60 mL/min (ref >60)
Glucose, Bld: 88 mg/dL (ref 70–99)
Potassium: 3.9 mmol/L (ref 3.5–5.1)
Sodium: 137 mmol/L (ref 135–145)

## 2022-02-01 LAB — CBC
HCT: 38.6 % — ABNORMAL LOW (ref 39.0–52.0)
Hemoglobin: 13.1 g/dL (ref 13.0–17.0)
MCH: 29.1 pg (ref 26.0–34.0)
MCHC: 33.9 g/dL (ref 30.0–36.0)
MCV: 85.8 fL (ref 80.0–100.0)
Platelets: 212 10*3/uL (ref 150–400)
RBC: 4.5 MIL/uL (ref 4.22–5.81)
RDW: 12.2 % (ref 11.5–15.5)
WBC: 4.1 10*3/uL (ref 4.0–10.5)
nRBC: 0 % (ref 0.0–0.2)

## 2022-02-01 LAB — LIPASE, BLOOD: Lipase: 39 U/L (ref 11–51)

## 2022-02-01 LAB — TROPONIN I (HIGH SENSITIVITY)
Troponin I (High Sensitivity): 50 ng/L — ABNORMAL HIGH (ref <18)
Troponin I (High Sensitivity): 77 ng/L — ABNORMAL HIGH (ref <18)

## 2022-02-01 MED ORDER — FAMOTIDINE 20 MG PO TABS: 20.0000 mg | ORAL_TABLET | Freq: Two times a day (BID) | ORAL | 0 refills | Status: DC

## 2022-02-01 MED ORDER — FAMOTIDINE 20 MG PO TABS
40.0000 mg | ORAL_TABLET | Freq: Once | ORAL | Status: AC
  Administered 2022-02-01: 40 mg via ORAL
  Filled 2022-02-01: qty 2

## 2022-02-01 MED ORDER — ALUMINUM-MAGNESIUM-SIMETHICONE 200-200-20 MG/5ML PO SUSP: 30.0000 mL | Freq: Three times a day (TID) | ORAL | 0 refills | Status: DC

## 2022-02-01 MED ORDER — ALUM & MAG HYDROXIDE-SIMETH 200-200-20 MG/5ML PO SUSP
30.0000 mL | Freq: Once | ORAL | Status: AC
  Administered 2022-02-01: 30 mL via ORAL
  Filled 2022-02-01: qty 30

## 2022-02-01 NOTE — ED Notes (Addendum)
See triage note. Pt reports constant CP since 3am that woke him from sleep; reports feels SOB and sweaty at times; denies nausea, dizziness and numbness/tingling anywhere. Denies history of anxiety and reflux issues. Denies any recent life stressors or changes in diet. Pt's skin dry, resp reg/unlabored and calm currently.

## 2022-02-01 NOTE — ED Provider Notes (Signed)
[facility] Provider Note    Event Date/Time   First MD Initiated Contact with Patient 02/01/22 1514     (approximate)   History   Chief Complaint: Chest Pain   HPI  Walter Duncan is a 26 y.o. male with no significant past medical history who comes the ED complaining of central chest pain that is sharp and worse with breathing that started at 3:00 AM today waking him up from sleep.  Constant.  No shortness of breath diaphoresis or vomiting, nonradiating, not exertional.  Denies any recent travel trauma hospitalization surgery or history of DVT or PE or any exogenous hormones.  No fever or cough.     Physical Exam   Triage Vital Signs: ED Triage Vitals  Enc Vitals Group     BP 02/01/22 1438 137/85     Pulse Rate 02/01/22 1438 85     Resp 02/01/22 1438 16     Temp 02/01/22 1438 98 F (36.7 C)     Temp Source 02/01/22 1438 Oral     SpO2 02/01/22 1438 99 %     Weight 02/01/22 1545 144 lb 13.5 oz (65.7 kg)     Height 02/01/22 1545 5\' 5"  (1.651 m)     Head Circumference --      Peak Flow --      Pain Score 02/01/22 1440 9     Pain Loc --      Pain Edu? --      Excl. in GC? --     Most recent vital signs: Vitals:   02/01/22 1438 02/01/22 1523  BP: 137/85 121/67  Pulse: 85 64  Resp: 16 17  Temp: 98 F (36.7 C)   SpO2: 99% 98%    General: Awake, no distress.  CV:  Good peripheral perfusion.  Regular rate and rhythm Resp:  Normal effort.  Clear to auscultation bilaterally Abd:  No distention.  Soft with epigastric and left upper quadrant tenderness Other:  No lower extremity edema.  No calf tenderness.  Negative Homans' sign.   ED Results / Procedures / Treatments   Labs (all labs ordered are listed, but only abnormal results are displayed) Labs Reviewed  CBC - Abnormal; Notable for the following components:      Result Value   HCT 38.6 (*)    All other components within normal limits  TROPONIN I (HIGH SENSITIVITY) - Abnormal;  Notable for the following components:   Troponin I (High Sensitivity) 50 (*)    All other components within normal limits  TROPONIN I (HIGH SENSITIVITY) - Abnormal; Notable for the following components:   Troponin I (High Sensitivity) 77 (*)    All other components within normal limits  BASIC METABOLIC PANEL  LIPASE, BLOOD  HEPATIC FUNCTION PANEL     EKG Interpreted by me Normal sinus rhythm rate of 87.  Normal axis intervals QRS ST segments and T waves.   RADIOLOGY Chest x-ray interpreted by me, no pneumothorax effusion or edema.  Radiology report reviewed.  Unremarkable.   PROCEDURES:  Procedures   MEDICATIONS ORDERED IN ED: Medications  famotidine (PEPCID) tablet 40 mg (40 mg Oral Given 02/01/22 1558)  alum & mag hydroxide-simeth (MAALOX/MYLANTA) 200-200-20 MG/5ML suspension 30 mL (30 mLs Oral Given 02/01/22 1558)     IMPRESSION / MDM / ASSESSMENT AND PLAN / ED COURSE  I reviewed the triage vital signs and the nursing notes.  Differential diagnosis includes, but is not limited to, GERD/gastritis, pancreatitis, biliary disease, pericarditis.  Doubt ACS PE dissection or pericardial effusion.  Patient's presentation is most consistent with acute presentation with potential threat to life or bodily function.  Patient presents with central chest pain and upper abdominal tenderness on exam, consistent with gastritis/GERD.  He is otherwise young and healthy, normal vital signs, Wells criteria negative.  EKG chest x-ray and labs are all unremarkable except for high-sensitivity troponin of 50.  Will trend this to ensure that it is not increasing.   ----------------------------------------- 6:21 PM on 02/01/2022 ----------------------------------------- Second troponin is 77, slight increase.  His presentation is entirely noncardiac with sharp pleuritic pain which has resolved after giving antacids.  EKG is normal.  I think cardiac work-up is not  indicated.  Will refer to cardiology for further assessment of this incidental finding.  He is now asymptomatic, and I think his presentation is very consistent with GERD and we will treat him with Pepcid.      FINAL CLINICAL IMPRESSION(S) / ED DIAGNOSES   Final diagnoses:  Nonspecific chest pain  Gastroesophageal reflux disease, unspecified whether esophagitis present     Rx / DC Orders   ED Discharge Orders          Ordered    famotidine (PEPCID) 20 MG tablet  2 times daily        02/01/22 1820    aluminum-magnesium hydroxide-simethicone (MAALOX) 200-200-20 MG/5ML SUSP  3 times daily before meals & bedtime        02/01/22 1820    Ambulatory referral to Cardiology       Comments: If you have not heard from the Cardiology office within the next 72 hours please call [phone].   02/01/22 1821             Note:  This document was prepared using Dragon voice recognition software and may include unintentional dictation errors.   Sharman Cheek, MD 02/01/22 Rickey Primus

## 2022-02-01 NOTE — ED Triage Notes (Signed)
Patient to ED via POV for CP that started around 3am. No cardiac history. States sharp pain in middle of chest.

## 2022-02-06 ENCOUNTER — Other Ambulatory Visit: Payer: Self-pay

## 2022-02-06 ENCOUNTER — Emergency Department (HOSPITAL_COMMUNITY)
Admit: 2022-02-06 | Discharge: 2022-02-06 | Disposition: A | Discharge status: Home / Self Care | Payer: Self-pay | Attending: Cardiovascular Disease | Admitting: Cardiovascular Disease

## 2022-02-06 ENCOUNTER — Emergency Department: Payer: Self-pay

## 2022-02-06 ENCOUNTER — Inpatient Hospital Stay
Admission: EM | Admit: 2022-02-06 | Discharge: 2022-02-07 | DRG: 247 | Disposition: A | Discharge status: Home / Self Care | Payer: Self-pay | Attending: Family Medicine | Admitting: Family Medicine

## 2022-02-06 ENCOUNTER — Encounter: Payer: Self-pay | Admitting: Emergency Medicine

## 2022-02-06 ENCOUNTER — Encounter
Admission: EM | Disposition: A | Discharge status: Home / Self Care | Payer: Self-pay | Source: Home / Self Care | Attending: Internal Medicine

## 2022-02-06 DIAGNOSIS — I2584 Coronary atherosclerosis due to calcified coronary lesion: Secondary | ICD-10-CM | POA: Diagnosis present

## 2022-02-06 DIAGNOSIS — Z20822 Contact with and (suspected) exposure to covid-19: Secondary | ICD-10-CM | POA: Diagnosis present

## 2022-02-06 DIAGNOSIS — E785 Hyperlipidemia, unspecified: Secondary | ICD-10-CM

## 2022-02-06 DIAGNOSIS — I214 Non-ST elevation (NSTEMI) myocardial infarction: Principal | ICD-10-CM | POA: Diagnosis present

## 2022-02-06 DIAGNOSIS — R778 Other specified abnormalities of plasma proteins: Secondary | ICD-10-CM

## 2022-02-06 DIAGNOSIS — Z8249 Family history of ischemic heart disease and other diseases of the circulatory system: Secondary | ICD-10-CM

## 2022-02-06 DIAGNOSIS — K219 Gastro-esophageal reflux disease without esophagitis: Secondary | ICD-10-CM | POA: Diagnosis present

## 2022-02-06 DIAGNOSIS — R079 Chest pain, unspecified: Secondary | ICD-10-CM

## 2022-02-06 DIAGNOSIS — Z720 Tobacco use: Secondary | ICD-10-CM

## 2022-02-06 DIAGNOSIS — Z79899 Other long term (current) drug therapy: Secondary | ICD-10-CM

## 2022-02-06 DIAGNOSIS — F17200 Nicotine dependence, unspecified, uncomplicated: Secondary | ICD-10-CM

## 2022-02-06 DIAGNOSIS — R001 Bradycardia, unspecified: Secondary | ICD-10-CM | POA: Diagnosis present

## 2022-02-06 DIAGNOSIS — F17210 Nicotine dependence, cigarettes, uncomplicated: Secondary | ICD-10-CM | POA: Diagnosis present

## 2022-02-06 DIAGNOSIS — I2510 Atherosclerotic heart disease of native coronary artery without angina pectoris: Secondary | ICD-10-CM

## 2022-02-06 DIAGNOSIS — Z823 Family history of stroke: Secondary | ICD-10-CM

## 2022-02-06 DIAGNOSIS — I25119 Atherosclerotic heart disease of native coronary artery with unspecified angina pectoris: Secondary | ICD-10-CM

## 2022-02-06 DIAGNOSIS — IMO0001 Reserved for inherently not codable concepts without codable children: Secondary | ICD-10-CM

## 2022-02-06 DIAGNOSIS — I409 Acute myocarditis, unspecified: Secondary | ICD-10-CM

## 2022-02-06 HISTORY — PX: LEFT HEART CATH AND CORONARY ANGIOGRAPHY: CATH118249

## 2022-02-06 HISTORY — PX: CORONARY STENT INTERVENTION: CATH118234

## 2022-02-06 LAB — BASIC METABOLIC PANEL
Anion gap: 8 (ref 5–15)
BUN: 15 mg/dL (ref 6–20)
CO2: 24 mmol/L (ref 22–32)
Calcium: 9.9 mg/dL (ref 8.9–10.3)
Chloride: 108 mmol/L (ref 98–111)
Creatinine, Ser: 0.87 mg/dL (ref 0.61–1.24)
GFR, Estimated: >60 mL/min (ref >60)
Glucose, Bld: 98 mg/dL (ref 70–99)
Potassium: 4.6 mmol/L (ref 3.5–5.1)
Sodium: 140 mmol/L (ref 135–145)

## 2022-02-06 LAB — ECHOCARDIOGRAM COMPLETE
AR max vel: 1.92 cm2
AV Area VTI: 1.77 cm2
AV Area mean vel: 1.78 cm2
AV Mean grad: 3 mmHg
AV Peak grad: 5.3 mmHg
Ao pk vel: 1.15 m/s
Area-P 1/2: 3.83 cm2
Height: 65 in
S' Lateral: 3.1 cm
Weight: 2320 [oz_av]

## 2022-02-06 LAB — CBC
HCT: 43.7 % (ref 39.0–52.0)
Hemoglobin: 14.4 g/dL (ref 13.0–17.0)
MCH: 28.9 pg (ref 26.0–34.0)
MCHC: 33 g/dL (ref 30.0–36.0)
MCV: 87.6 fL (ref 80.0–100.0)
Platelets: 293 K/uL (ref 150–400)
RBC: 4.99 MIL/uL (ref 4.22–5.81)
RDW: 12 % (ref 11.5–15.5)
WBC: 7 K/uL (ref 4.0–10.5)
nRBC: 0 % (ref 0.0–0.2)

## 2022-02-06 LAB — URINE DRUG SCREEN, QUALITATIVE (ARMC ONLY)
Amphetamines, Ur Screen: NOT DETECTED
Barbiturates, Ur Screen: NOT DETECTED
Benzodiazepine, Ur Scrn: NOT DETECTED
Cannabinoid 50 Ng, Ur ~~LOC~~: NOT DETECTED
Cocaine Metabolite,Ur ~~LOC~~: NOT DETECTED
MDMA (Ecstasy)Ur Screen: NOT DETECTED
Methadone Scn, Ur: NOT DETECTED
Opiate, Ur Screen: NOT DETECTED
Phencyclidine (PCP) Ur S: NOT DETECTED
Tricyclic, Ur Screen: NOT DETECTED

## 2022-02-06 LAB — TROPONIN I (HIGH SENSITIVITY)
Troponin I (High Sensitivity): 7262 ng/L
Troponin I (High Sensitivity): 8768 ng/L (ref <18)

## 2022-02-06 LAB — PROTIME-INR
INR: 0.9 (ref 0.8–1.2)
Prothrombin Time: 12.5 seconds (ref 11.4–15.2)

## 2022-02-06 LAB — RESP PANEL BY RT-PCR (FLU A&B, COVID) ARPGX2
Influenza A by PCR: NEGATIVE
Influenza B by PCR: NEGATIVE
SARS Coronavirus 2 by RT PCR: NEGATIVE

## 2022-02-06 LAB — CK: Total CK: 289 U/L (ref 49–397)

## 2022-02-06 LAB — APTT: aPTT: 31 seconds (ref 24–36)

## 2022-02-06 SURGERY — LEFT HEART CATH AND CORONARY ANGIOGRAPHY
Anesthesia: Moderate Sedation

## 2022-02-06 MED ORDER — SODIUM CHLORIDE 0.9 % WEIGHT BASED INFUSION: 3.0000 mL/kg/h | INTRAVENOUS | Status: DC

## 2022-02-06 MED ORDER — HEPARIN (PORCINE) 25000 UT/250ML-% IV SOLN
800.0000 [IU]/h | INTRAVENOUS | Status: DC
Start: 2022-02-06 — End: 2022-02-06
  Administered 2022-02-06: 800 [IU]/h via INTRAVENOUS
  Filled 2022-02-06: qty 250

## 2022-02-06 MED ORDER — ONDANSETRON HCL 4 MG/2ML IJ SOLN: 4.0000 mg | Freq: Four times a day (QID) | INTRAMUSCULAR | Status: DC | PRN

## 2022-02-06 MED ORDER — HEPARIN (PORCINE) IN NACL 1000-0.9 UT/500ML-% IV SOLN
INTRAVENOUS | Status: AC
  Filled 2022-02-06: qty 1000

## 2022-02-06 MED ORDER — TICAGRELOR 90 MG PO TABS
ORAL_TABLET | ORAL | Status: AC
  Filled 2022-02-06: qty 2

## 2022-02-06 MED ORDER — HEPARIN BOLUS VIA INFUSION
4000.0000 [IU] | Freq: Once | INTRAVENOUS | Status: AC
  Administered 2022-02-06: 4000 [IU] via INTRAVENOUS
  Filled 2022-02-06: qty 4000

## 2022-02-06 MED ORDER — HEPARIN SODIUM (PORCINE) 1000 UNIT/ML IJ SOLN
INTRAMUSCULAR | Status: AC
  Filled 2022-02-06: qty 10

## 2022-02-06 MED ORDER — SODIUM CHLORIDE 0.9 % WEIGHT BASED INFUSION: 1.0000 mL/kg/h | INTRAVENOUS | Status: DC

## 2022-02-06 MED ORDER — FENTANYL CITRATE (PF) 100 MCG/2ML IJ SOLN
INTRAMUSCULAR | Status: AC
  Filled 2022-02-06: qty 2

## 2022-02-06 MED ORDER — ASPIRIN 81 MG PO CHEW: 81.0000 mg | CHEWABLE_TABLET | ORAL | Status: DC

## 2022-02-06 MED ORDER — ATORVASTATIN CALCIUM 80 MG PO TABS
80.0000 mg | ORAL_TABLET | Freq: Every day | ORAL | Status: DC
  Administered 2022-02-07: 80 mg via ORAL
  Filled 2022-02-06: qty 1

## 2022-02-06 MED ORDER — ACETAMINOPHEN 325 MG PO TABS: 650.0000 mg | ORAL_TABLET | ORAL | Status: DC | PRN

## 2022-02-06 MED ORDER — ATORVASTATIN CALCIUM 20 MG PO TABS: 40.0000 mg | ORAL_TABLET | Freq: Every day | ORAL | Status: DC

## 2022-02-06 MED ORDER — TICAGRELOR 90 MG PO TABS
ORAL_TABLET | ORAL | Status: DC | PRN
  Administered 2022-02-06: 180 mg via ORAL

## 2022-02-06 MED ORDER — MIDAZOLAM HCL 2 MG/2ML IJ SOLN
INTRAMUSCULAR | Status: DC | PRN
  Administered 2022-02-06: 1 mg via INTRAVENOUS

## 2022-02-06 MED ORDER — SODIUM CHLORIDE 0.9% FLUSH: 3.0000 mL | INTRAVENOUS | Status: DC | PRN

## 2022-02-06 MED ORDER — SODIUM CHLORIDE 0.9 % WEIGHT BASED INFUSION: 1.0000 mL/kg/h | INTRAVENOUS | Status: AC

## 2022-02-06 MED ORDER — IOHEXOL 350 MG/ML SOLN
75.0000 mL | Freq: Once | INTRAVENOUS | Status: AC | PRN
  Administered 2022-02-06: 75 mL via INTRAVENOUS

## 2022-02-06 MED ORDER — NITROGLYCERIN 1 MG/10 ML FOR IR/CATH LAB
INTRA_ARTERIAL | Status: DC | PRN
  Administered 2022-02-06 (×2): 100 ug via INTRA_ARTERIAL
  Administered 2022-02-06: 200 ug via INTRA_ARTERIAL

## 2022-02-06 MED ORDER — HEPARIN (PORCINE) IN NACL 1000-0.9 UT/500ML-% IV SOLN
INTRAVENOUS | Status: DC | PRN
  Administered 2022-02-06: 1000 mL

## 2022-02-06 MED ORDER — SODIUM CHLORIDE 0.9 % IV SOLN: 250.0000 mL | INTRAVENOUS | Status: DC | PRN

## 2022-02-06 MED ORDER — HEPARIN SODIUM (PORCINE) 1000 UNIT/ML IJ SOLN
INTRAMUSCULAR | Status: DC | PRN
  Administered 2022-02-06: 4000 [IU] via INTRAVENOUS
  Administered 2022-02-06: 3000 [IU] via INTRAVENOUS
  Administered 2022-02-06: 2000 [IU] via INTRAVENOUS

## 2022-02-06 MED ORDER — MIDAZOLAM HCL 2 MG/2ML IJ SOLN
INTRAMUSCULAR | Status: AC
  Filled 2022-02-06: qty 2

## 2022-02-06 MED ORDER — LIDOCAINE HCL 1 % IJ SOLN
INTRAMUSCULAR | Status: AC
  Filled 2022-02-06: qty 20

## 2022-02-06 MED ORDER — VERAPAMIL HCL 2.5 MG/ML IV SOLN
INTRAVENOUS | Status: DC | PRN
  Administered 2022-02-06: 2.5 mg via INTRAVENOUS

## 2022-02-06 MED ORDER — SODIUM CHLORIDE 0.9% FLUSH
3.0000 mL | Freq: Two times a day (BID) | INTRAVENOUS | Status: DC
  Administered 2022-02-07: 3 mL via INTRAVENOUS

## 2022-02-06 MED ORDER — ASPIRIN 81 MG PO TBEC
81.0000 mg | DELAYED_RELEASE_TABLET | Freq: Every day | ORAL | Status: DC
  Administered 2022-02-07: 81 mg via ORAL
  Filled 2022-02-06: qty 1

## 2022-02-06 MED ORDER — FENTANYL CITRATE (PF) 100 MCG/2ML IJ SOLN
INTRAMUSCULAR | Status: DC | PRN
  Administered 2022-02-06: 25 ug via INTRAVENOUS

## 2022-02-06 MED ORDER — LIDOCAINE HCL (PF) 1 % IJ SOLN
INTRAMUSCULAR | Status: DC | PRN
  Administered 2022-02-06: 2 mL

## 2022-02-06 MED ORDER — VERAPAMIL HCL 2.5 MG/ML IV SOLN
INTRAVENOUS | Status: AC
  Filled 2022-02-06: qty 2

## 2022-02-06 MED ORDER — ASPIRIN 81 MG PO CHEW
324.0000 mg | CHEWABLE_TABLET | Freq: Once | ORAL | Status: AC
Start: 2022-02-06 — End: 2022-02-06
  Administered 2022-02-06: 324 mg via ORAL
  Filled 2022-02-06: qty 4

## 2022-02-06 MED ORDER — TICAGRELOR 90 MG PO TABS
90.0000 mg | ORAL_TABLET | Freq: Two times a day (BID) | ORAL | Status: DC
  Administered 2022-02-06 – 2022-02-07 (×2): 90 mg via ORAL
  Filled 2022-02-06 (×2): qty 1

## 2022-02-06 MED ORDER — SODIUM CHLORIDE 0.9% FLUSH: 3.0000 mL | Freq: Two times a day (BID) | INTRAVENOUS | Status: DC

## 2022-02-06 MED ORDER — NITROGLYCERIN 0.4 MG SL SUBL: 0.4000 mg | SUBLINGUAL_TABLET | SUBLINGUAL | Status: DC | PRN

## 2022-02-06 SURGICAL SUPPLY — 21 items
BALLN EUPHORA RX 2.0X12 (BALLOONS) ×2
BALLN ~~LOC~~ TREK NEO RX 2.5X15 (BALLOONS) ×1 IMPLANT
BALLN ~~LOC~~ TREK NEO RX 2.75X15 (BALLOONS) ×1 IMPLANT
BALLOON EUPHORA RX 2.0X12 (BALLOONS) IMPLANT
BAND ZEPHYR COMPRESS 30 LONG (HEMOSTASIS) ×1 IMPLANT
CATH INFINITI 5 FR JL3.5 (CATHETERS) ×1 IMPLANT
CATH INFINITI 5FR JK (CATHETERS) ×1 IMPLANT
CATH LAUNCHER 5F EBU3.5 (CATHETERS) ×1 IMPLANT
DRAPE BRACHIAL (DRAPES) ×1 IMPLANT
GLIDESHEATH SLEND SS 6F .021 (SHEATH) ×1 IMPLANT
GUIDEWIRE INQWIRE 1.5J.035X260 (WIRE) IMPLANT
INQWIRE 1.5J .035X260CM (WIRE) ×2
KIT ENCORE 26 ADVANTAGE (KITS) ×1 IMPLANT
PACK CARDIAC CATH (CUSTOM PROCEDURE TRAY) ×2 IMPLANT
PROTECTION STATION PRESSURIZED (MISCELLANEOUS) ×2
SET ATX SIMPLICITY (MISCELLANEOUS) ×1 IMPLANT
STATION PROTECTION PRESSURIZED (MISCELLANEOUS) IMPLANT
STENT ONYX FRONTIER 2.25X18 (Permanent Stent) ×1 IMPLANT
STENT ONYX FRONTIER 2.75X18 (Permanent Stent) ×1 IMPLANT
TUBING CIL FLEX 10 FLL-RA (TUBING) ×1 IMPLANT
WIRE RUNTHROUGH .014X180CM (WIRE) ×1 IMPLANT

## 2022-02-06 NOTE — ED Provider Notes (Signed)
[facility] Provider Note    Event Date/Time   First MD Initiated Contact with Patient 02/06/22 [phone]     (approximate)   History   Chief Complaint Chest Pain   HPI  Walter Duncan is a 26 y.o. male with no significant past medical history presents to the ED complaining of chest pain.  Patient reports that he has been dealing with about 1 week of constant pain in the right side of his chest.  He describes it as sharp and worse when he takes a deep breath, denies any associated fevers or cough.  He has not noticed any pain or swelling in his legs, denies any recent surgeries or long trips.  He was seen in the ED for the symptoms recently and diagnosed with GERD, but states symptoms have not improved on prescribed regimen.  He states he has felt short of breath at times, but denies any difficulty breathing currently.  He denies any drug use, specifically denies any cocaine use.     Physical Exam   Triage Vital Signs: ED Triage Vitals [02/06/22 0850]  Enc Vitals Group     BP 104/73     Pulse Rate 70     Resp 18     Temp 98.4 F (36.9 C)     Temp Source Oral     SpO2 98 %     Weight 145 lb (65.8 kg)     Height 5\' 5"  (1.651 m)     Head Circumference      Peak Flow      Pain Score 8     Pain Loc      Pain Edu?      Excl. in GC?     Most recent vital signs: Vitals:   02/06/22 0850 02/06/22 1015  BP: 104/73 128/71  Pulse: 70 (!) 49  Resp: 18   Temp: 98.4 F (36.9 C)   SpO2: 98% 100%    Constitutional: Alert and oriented. Eyes: Conjunctivae are normal. Head: Atraumatic. Nose: No congestion/rhinnorhea. Mouth/Throat: Mucous membranes are moist.  Cardiovascular: Normal rate, regular rhythm. Grossly normal heart sounds.  2+ radial pulses bilaterally. Respiratory: Normal respiratory effort.  No retractions. Lungs CTAB.  No chest wall tenderness to palpation. Gastrointestinal: Soft and nontender. No distention. Musculoskeletal: No lower extremity  tenderness nor edema.  Neurologic:  Normal speech and language. No gross focal neurologic deficits are appreciated.    ED Results / Procedures / Treatments   Labs (all labs ordered are listed, but only abnormal results are displayed) Labs Reviewed  TROPONIN I (HIGH SENSITIVITY) - Abnormal; Notable for the following components:      Result Value   Troponin I (High Sensitivity) 7,262 (*)    All other components within normal limits  RESP PANEL BY RT-PCR (FLU A&B, COVID) ARPGX2  BASIC METABOLIC PANEL  CBC  URINE DRUG SCREEN, QUALITATIVE ([facility] ONLY)  APTT  PROTIME-INR  TROPONIN I (HIGH SENSITIVITY)     EKG  ED ECG REPORT I, 04/08/22, the attending physician, personally viewed and interpreted this ECG.   Date: 02/06/2022  EKG Time: 8:55  Rate: 64  Rhythm: normal sinus rhythm  Axis: Normal  Intervals:none  ST&T Change: Inferolateral Q waves, no ST/T wave changes  RADIOLOGY Chest x-ray reviewed and interpreted by me with no infiltrate, edema, or effusion.  PROCEDURES:  Critical Care performed: Yes, see critical care procedure note(s)  .Critical Care  Performed by: 04/08/2022, MD Authorized by: Chesley Noon,  MD   Critical care provider statement:    Critical care time (minutes):  30   Critical care time was exclusive of:  Separately billable procedures and treating other patients and teaching time   Critical care was necessary to treat or prevent imminent or life-threatening deterioration of the following conditions:  Cardiac failure   Critical care was time spent personally by me on the following activities:  Development of treatment plan with patient or surrogate, discussions with consultants, evaluation of patient's response to treatment, examination of patient, ordering and review of laboratory studies, ordering and review of radiographic studies, ordering and performing treatments and interventions, pulse oximetry, re-evaluation of patient's condition  and review of old charts   I assumed direction of critical care for this patient from another provider in my specialty: no     Care discussed with: admitting provider      MEDICATIONS ORDERED IN ED: Medications  heparin ADULT infusion 100 units/mL (25000 units/252mL) (800 Units/hr Intravenous New Bag/Given 02/06/22 1020)  aspirin chewable tablet 324 mg (324 mg Oral Given 02/06/22 0953)  iohexol (OMNIPAQUE) 350 MG/ML injection 75 mL (75 mLs Intravenous Contrast Given 02/06/22 0953)  heparin bolus via infusion 4,000 Units (4,000 Units Intravenous Bolus from Bag 02/06/22 1020)     IMPRESSION / MDM / ASSESSMENT AND PLAN / ED COURSE  I reviewed the triage vital signs and the nursing notes.                              26 y.o. male with no significant past medical history who presents to the ED complaining of constant right-sided and pleuritic chest pain for about the past week with occasional shortness of breath.  Patient's presentation is most consistent with acute presentation with potential threat to life or bodily function.  Differential diagnosis includes, but is not limited to, ACS, PE, pneumonia, pneumothorax, myocarditis, pericarditis, dissection, musculoskeletal pain, anxiety.  Patient well-appearing and in no acute distress, vital signs are unremarkable.  EKG shows small inferolateral Q waves, although these appear similar to his recent ED visit, no ST/T wave changes noted.  Chest x-ray is unremarkable, however initial labs are concerning for markedly elevated troponin at greater than 7000.  No significant anemia, leukocytosis, electrolyte abnormality, or AKI noted.  We will give patient dose of aspirin and start on IV heparin, concern would be highest for PE versus myocarditis at this time, lower suspicion for ACS given lack of risk factors.  Case discussed with Dr. Kirke Corin of cardiology, who agrees with plan for admission and will follow, plan is to proceed with echocardiogram during  admission.  CTA is negative for PE but does show age advanced coronary sclerosis.  We will continue to trend troponin, likely has been initiated at this time.  Case discussed with hospitalist for admission.      FINAL CLINICAL IMPRESSION(S) / ED DIAGNOSES   Final diagnoses:  Nonspecific chest pain  Elevated troponin  Acute myocarditis, unspecified myocarditis type     Rx / DC Orders   ED Discharge Orders     None        Note:  This document was prepared using Dragon voice recognition software and may include unintentional dictation errors.   Chesley Noon, MD 02/06/22 1034

## 2022-02-06 NOTE — Assessment & Plan Note (Addendum)
Patient presents for evaluation of chest pain mostly over the right anterior chest wall worse with inspiration. Twelve-lead EKG shows no acute findings but troponin is elevated at 7262 CT angiogram of the chest is negative for acute pulmonary embolism but shows atherosclerotic coronary artery disease. Discussed with cardiology who plans on taking patient for left heart cath today Continue heparin drip initiated in the ER Patient received a dose of aspirin Not on beta-blockers due to bradycardia with heart rates in the 50s at rest Will start patient on atorvastatin Obtain 2D echocardiogram to assess LVEF and rule out regional wall motion abnormality.

## 2022-02-06 NOTE — ED Triage Notes (Signed)
Patient to ED for right sided chest pain that started yesterday morning. Patient seen a few days ago for same. 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

## 2022-02-06 NOTE — Progress Notes (Signed)
*  PRELIMINARY RESULTS* Echocardiogram 2D Echocardiogram has been performed.  Cristela Blue 02/06/2022, 11:21 AM

## 2022-02-06 NOTE — H&P (Signed)
History and Physical    Patient: Walter Duncan DOB: 24-Nov-1995 DOA: 02/06/2022 DOS: the patient was seen and examined on 02/06/2022 PCP: Patient, No Pcp Per  Patient coming from: Home  Chief Complaint:  Chief Complaint  Patient presents with   Chest Pain   HPI: Walter Duncan is a 26 y.o. male with no significant past medical history except for nicotine dependence who presents to the ER for the second time in 1 week for evaluation of chest pain.  Patient states that the pain is mostly over the right anterior chest wall.  It is a constant pain and is rated 6 x 10 in intensity at its worst.  Pain is not related to rest or exertion and radiates to his abdomen.  He denies having any nausea or vomiting.  Pain is worse on inspiration.  He denies having any fever, no chills, no cough, no shortness of breath or diaphoresis associated with the pain. He was seen in the ED 5 days prior to admission and had slightly elevated troponin levels with a normal EKG.  His symptoms resolved with antacids that he received in the ER and he was discharged home with diagnosis of GERD. He denies having any changes in bowel habits, no urinary symptoms, no dizziness, no lightheadedness, no headache, no blurred vision or focal deficit. He has a family history of coronary artery disease, his uncle had an acute MI His troponin is elevated at 7262 Twelve-lead EKG shows normal sinus rhythm with sinus arrhythmia CT angiogram shows no acute cardiopulmonary disease and no evidence of acute pulmonary emboli.  Atherosclerotic coronary artery disease.  Age advanced coronary artery atherosclerosis   Review of Systems: As mentioned in the history of present illness. All other systems reviewed and are negative. History reviewed. No pertinent past medical history. History reviewed. No pertinent surgical history. Social History:  reports that he has been smoking cigarettes. He has been smoking an average of .5 packs per  day. He does not have any smokeless tobacco history on file. He reports that he does not drink alcohol. No history on file for drug use.  No Known Allergies  No family history on file.  Prior to Admission medications   Medication Sig Start Date End Date Taking? Authorizing Provider  acyclovir (ZOVIRAX) 400 MG tablet Take 2 tablets (800 mg total) by mouth 5 (five) times daily. X 10 days 12/11/15   Evon Slack, PA-C  aluminum-magnesium hydroxide-simethicone (MAALOX) 200-200-20 MG/5ML SUSP Take 30 mLs by mouth 4 (four) times daily -  before meals and at bedtime. 02/01/22   Sharman Cheek, MD  famotidine (PEPCID) 20 MG tablet Take 1 tablet (20 mg total) by mouth 2 (two) times daily. 02/01/22   Sharman Cheek, MD    Physical Exam: Vitals:   02/06/22 0850 02/06/22 1015  BP: 104/73 128/71  Pulse: 70 (!) 49  Resp: 18   Temp: 98.4 F (36.9 C)   TempSrc: Oral   SpO2: 98% 100%  Weight: 65.8 kg   Height: 5\' 5"  (1.651 m)    Physical Exam Vitals and nursing note reviewed.  Constitutional:      Appearance: He is well-developed.  HENT:     Head: Normocephalic and atraumatic.  Eyes:     Pupils: Pupils are equal, round, and reactive to light.  Cardiovascular:     Rate and Rhythm: Normal rate and regular rhythm.     Heart sounds: Normal heart sounds.  Pulmonary:     Effort: Pulmonary  effort is normal.     Breath sounds: Normal breath sounds.  Abdominal:     General: Bowel sounds are normal.     Palpations: Abdomen is soft.  Musculoskeletal:        General: Normal range of motion.     Cervical back: Normal range of motion and neck supple.  Skin:    General: Skin is warm and dry.  Neurological:     General: No focal deficit present.     Mental Status: He is alert.  Psychiatric:        Mood and Affect: Mood normal.        Behavior: Behavior normal.     Data Reviewed: Data Reviewed: Relevant notes from primary care and specialist visits, past discharge summaries as available  in EHR, including Care Everywhere. Prior diagnostic testing as pertinent to current admission diagnoses Updated medications and problem lists for reconciliation ED course, including vitals, labs, imaging, treatment and response to treatment Triage notes, nursing and pharmacy notes and ED provider's notes Notable results as noted in HPI Labs reviewed.  PT 12.5, INR 0.9, sodium 140, potassium 4.6, chloride 108, bicarb 24, glucose 98, BUN 15, creatinine 0.87, calcium 9.9, troponin 7262, white count 7.0, hemoglobin 14.4, hematocrit 43.7, platelet count 293 Chest x-ray reviewed by me shows clear lungs CT angiogram is negative for acute cardiopulmonary disease but shows atherosclerotic coronary artery disease. Twelve-lead EKG reviewed by me shows sinus rhythm with sinus arrhythmia. There are no new results to review at this time.  Assessment and Plan: * NSTEMI (non-ST elevated myocardial infarction) [facility]) Patient presents for evaluation of chest pain mostly over the right anterior chest wall worse with inspiration. Twelve-lead EKG shows no acute findings but troponin is elevated at 7262 CT angiogram of the chest is negative for acute pulmonary embolism but shows atherosclerotic coronary artery disease. Discussed with cardiology who plans on taking patient for left heart cath today Continue heparin drip initiated in the ER Patient received a dose of aspirin Not on beta-blockers due to bradycardia with heart rates in the 50s at rest Will start patient on atorvastatin Obtain 2D echocardiogram to assess LVEF and rule out regional wall motion abnormality.      Advance Care Planning:   Code Status: Full Code   Consults: Cardiology  Family Communication: Greater than 50% of time was spent discussing patient's condition and plan of care with him at the bedside.  All questions and concerns have been addressed.  He verbalizes understanding and agrees with the plan.  Severity of Illness: The  appropriate patient status for this patient is INPATIENT. Inpatient status is judged to be reasonable and necessary in order to provide the required intensity of service to ensure the patient's safety. The patient's presenting symptoms, physical exam findings, and initial radiographic and laboratory data in the context of their chronic comorbidities is felt to place them at high risk for further clinical deterioration. Furthermore, it is not anticipated that the patient will be medically stable for discharge from the hospital within 2 midnights of admission.   * I certify that at the point of admission it is my clinical judgment that the patient will require inpatient hospital care spanning beyond 2 midnights from the point of admission due to high intensity of service, high risk for further deterioration and high frequency of surveillance required.*  Author: Lucile Shutters, MD 02/06/2022 11:52 AM  For on call review [email or web address].

## 2022-02-06 NOTE — Consult Note (Signed)
Cardiology Consultation:   Patient ID: RANE BLITCH MRN: [id number]; DOB: 07/06/1995  Admit date: 02/06/2022 Date of Consult: 02/06/2022  PCP:  Patient, No Pcp Per   [facility] Providers Cardiologist:  None   new Kirke Corin)     Patient Profile:   Walter Duncan is a 26 y.o. male with a hx of tobacco use who is being seen 02/06/2022 for the evaluation of chest pain and elevated troponin at the request of Dr. Larinda Buttery.  History of Present Illness:   Mr. Wain is a 25 year old male with no prior cardiac history.  He has known history of tobacco use and smokes one third of a pack per day.  In addition, he reports family history of premature coronary artery disease as his uncle died of heart disease and stroke in his 76s.  He denies recreational drug use.  He does not take any medications.  He came to the emergency room last week with chest pain and was treated for GERD.  However, he had no improvement in symptoms and if anything, he started feeling worse.  He describes substernal and right-sided chest pain that seems to be constant but associated with significant shortness of breath.  He denies any heavy or tightness feeling.  No fevers or chills.  He has no symptoms of upper respiratory tract infection.  He was found to have significantly elevated troponin at 7000.  CTA of the chest showed no evidence of pulmonary embolism or aortic dissection.  Incidentally, significant coronary calcifications were noted especially in the LAD distribution.  CT images were personally reviewed by me.   History reviewed. No pertinent past medical history.  History reviewed. No pertinent surgical history.   Home Medications:  Prior to Admission medications   Medication Sig Start Date End Date Taking? Authorizing Provider  acyclovir (ZOVIRAX) 400 MG tablet Take 2 tablets (800 mg total) by mouth 5 (five) times daily. X 10 days 12/11/15   Evon Slack, PA-C  aluminum-magnesium hydroxide-simethicone (MAALOX)  200-200-20 MG/5ML SUSP Take 30 mLs by mouth 4 (four) times daily -  before meals and at bedtime. 02/01/22   Sharman Cheek, MD  famotidine (PEPCID) 20 MG tablet Take 1 tablet (20 mg total) by mouth 2 (two) times daily. 02/01/22   Sharman Cheek, MD    Inpatient Medications: Scheduled Meds:  Continuous Infusions:  heparin 800 Units/hr (02/06/22 1020)   PRN Meds:   Allergies:   No Known Allergies  Social History:   Social History   Socioeconomic History   Marital status: Single    Spouse name: Not on file   Number of children: Not on file   Years of education: Not on file   Highest education level: Not on file  Occupational History   Not on file  Tobacco Use   Smoking status: Every Day    Packs/day: 0.50    Types: Cigarettes   Smokeless tobacco: Not on file  Vaping Use   Vaping Use: Never used  Substance and Sexual Activity   Alcohol use: No   Drug use: Not on file   Sexual activity: Not on file  Other Topics Concern   Not on file  Social History Narrative   Not on file   Social Determinants of Health   Financial Resource Strain: Not on file  Food Insecurity: Not on file  Transportation Needs: Not on file  Physical Activity: Not on file  Stress: Not on file  Social Connections: Not on file  Intimate Partner  Violence: Not on file    Family History:   Family history is remarkable for premature coronary artery disease as outlined above.  ROS:  Please see the history of present illness.   All other ROS reviewed and negative.     Physical Exam/Data:   Vitals:   02/06/22 0850 02/06/22 1015  BP: 104/73 128/71  Pulse: 70 (!) 49  Resp: 18   Temp: 98.4 F (36.9 C)   TempSrc: Oral   SpO2: 98% 100%  Weight: 65.8 kg   Height: 5\' 5"  (1.651 m)    No intake or output data in the 24 hours ending 02/06/22 1104    02/06/2022    8:50 AM 02/01/2022    3:45 PM 01/24/2022    2:12 PM  Last 3 Weights  Weight (lbs) 145 lb 144 lb 13.5 oz 145 lb  Weight (kg) 65.772 kg  65.7 kg 65.772 kg     Body mass index is 24.13 kg/m.  General:  Well nourished, well developed, in no acute distress HEENT: normal Neck: no JVD Vascular: No carotid bruits; Distal pulses 2+ bilaterally Cardiac:  normal S1, S2; RRR; no murmur  Lungs:  clear to auscultation bilaterally, no wheezing, rhonchi or rales  Abd: soft, nontender, no hepatomegaly  Ext: no edema Musculoskeletal:  No deformities, BUE and BLE strength normal and equal Skin: warm and dry  Neuro:  CNs 2-12 intact, no focal abnormalities noted Psych:  Normal affect   EKG:  The EKG was personally reviewed and demonstrates: Sinus rhythm with diffuse J-point elevation suggestive of early repolarization, possible pericarditis but no PR depression. Telemetry:  Telemetry was personally reviewed and demonstrates:    Relevant CV Studies: Echocardiogram is being done at the bedside and showed normal LV systolic function.  No clear wall motion abnormalities.  Laboratory Data:  High Sensitivity Troponin:   Recent Labs  Lab 02/01/22 1438 02/01/22 1640 02/06/22 0853  TROPONINIHS 50* 77* 7,262*     Chemistry Recent Labs  Lab 02/01/22 1438 02/06/22 0853  NA 137 140  K 3.9 4.6  CL 107 108  CO2 24 24  GLUCOSE 88 98  BUN 17 15  CREATININE 0.83 0.87  CALCIUM 9.2 9.9  GFRNONAA >60 >60  ANIONGAP 6 8    Recent Labs  Lab 02/01/22 1453  PROT 7.1  ALBUMIN 4.1  AST 32  ALT 39  ALKPHOS 65  BILITOT 0.7   Lipids No results for input(s): "CHOL", "TRIG", "HDL", "LABVLDL", "LDLCALC", "CHOLHDL" in the last 168 hours.  Hematology Recent Labs  Lab 02/01/22 1438 02/06/22 0853  WBC 4.1 7.0  RBC 4.50 4.99  HGB 13.1 14.4  HCT 38.6* 43.7  MCV 85.8 87.6  MCH 29.1 28.9  MCHC 33.9 33.0  RDW 12.2 12.0  PLT 212 293   Thyroid No results for input(s): "TSH", "FREET4" in the last 168 hours.  BNPNo results for input(s): "BNP", "PROBNP" in the last 168 hours.  DDimer No results for input(s): "DDIMER" in the last 168  hours.   Radiology/Studies:  CT Angio Chest PE W/Cm &/Or Wo Cm  Result Date: 02/06/2022 CLINICAL DATA:  Right-sided chest pain beginning yesterday morning. Suspect pulmonary embolism. EXAM: CT ANGIOGRAPHY CHEST WITH CONTRAST TECHNIQUE: Multidetector CT imaging of the chest was performed using the standard protocol during bolus administration of intravenous contrast. Multiplanar CT image reconstructions and MIPs were obtained to evaluate the vascular anatomy. RADIATION DOSE REDUCTION: This exam was performed according to the departmental dose-optimization program which includes automated exposure control,  adjustment of the mA and/or kV according to patient size and/or use of iterative reconstruction technique. CONTRAST:  84mL OMNIPAQUE IOHEXOL 350 MG/ML SOLN COMPARISON:  Chest x-ray 02/06/2022 FINDINGS: Cardiovascular: Heart is normal size. There is mild calcified plaque over the left anterior descending, lateral circumflex and right coronary arteries. Thoracic aorta is normal in caliber. Normal 3 vessel takeoff from the aortic arch. Pulmonary arterial system is well opacified without evidence of emboli. Mediastinum/Nodes: No mediastinal or hilar adenopathy. Lungs/Pleura: Lungs are adequately inflated without focal airspace consolidation or effusion. Airways are normal. Upper Abdomen: No acute findings. Musculoskeletal: No focal abnormality. Review of the MIP images confirms the above findings. IMPRESSION: 1. No acute cardiopulmonary disease and no evidence of acute pulmonary emboli. 2. Atherosclerotic coronary artery disease. Age advanced coronary artery atherosclerosis. Recommend assessment of coronary risk factors. Electronically Signed   By: Elberta Fortis M.D.   On: 02/06/2022 10:16   DG Chest 2 View  Result Date: 02/06/2022 CLINICAL DATA:  Chest pain. EXAM: CHEST - 2 VIEW COMPARISON:  Chest x-ray 02/01/2022. FINDINGS: The heart size and mediastinal contours are within normal limits. Both lungs are clear.  No visible pleural effusions or pneumothorax. No acute osseous abnormality. IMPRESSION: No active cardiopulmonary disease. Electronically Signed   By: Feliberto Harts M.D.   On: 02/06/2022 09:10     Assessment and Plan:   Elevated troponin: The exact etiology is still not entirely clear.  Differential diagnosis include non-ST elevation myocardial infarction or myocarditis.  His symptoms and presentation is overall atypical for both given that he has no other associated symptoms.  I am concerned about the CT scan finding of significant coronary artery calcification which establishes coronary artery disease.  In addition, he is a smoker and has family history of premature coronary artery disease.  As such, I recommend proceeding with left heart catheterization possible PCI.  I discussed the procedure in details as well as risk and benefits.  I called the grandmother and left her a message regarding the plan.  Further recommendations to follow after cardiac catheterization. Tobacco use: Smoking cessation is advised.    For questions or updates, please contact [facility] Please consult [email or web address] for contact info under    Signed, Lorine Bears, MD  02/06/2022 11:04 AM

## 2022-02-06 NOTE — Consult Note (Signed)
ANTICOAGULATION CONSULT NOTE  Pharmacy Consult for Heparin Indication: chest pain/ACS  No Known Allergies  Patient Measurements: Height: 5\' 5"  (165.1 cm) Weight: 65.8 kg (145 lb) IBW/kg (Calculated) : 61.5 Heparin Dosing Weight: 65.8 kg  Vital Signs: Temp: 98.4 F (36.9 C) (08/08 0850) Temp Source: Oral (08/08 0850) BP: 128/71 (08/08 1015) Pulse Rate: 49 (08/08 1015)  Labs: Recent Labs    02/06/22 0853 02/06/22 1012  HGB 14.4  --   HCT 43.7  --   PLT 293  --   APTT  --  31  LABPROT  --  12.5  INR  --  0.9  CREATININE 0.87  --   TROPONINIHS 7,262*  --     Estimated Creatinine Clearance: 110 mL/min (by C-G formula based on SCr of 0.87 mg/dL).   Medical History: History reviewed. No pertinent past medical history.  Medications:  No history of chronic anticoagulant use PTA  Assessment: Pharmacy has been consulted to initiate and monitor heparin infusion in 26yo male admitted with right-sided chest pain and elevated troponin. Patient has no prior cardiac history but has family history of premature CAD as his uncle died of heart disease and stroke in his 48s. Per cardiology, planning for [facility] and possible PCI. Will continue heparin until after procedure.   Baseline labs: aPTT 31 sec, INR 0.9, Hgb 14.4, Plts 293  Goal of Therapy:  Heparin level 0.3-0.7 units/ml Monitor platelets by anticoagulation protocol: Yes   Plan:  Give 4000 units bolus x 1 Start heparin infusion at 800 units/hr Check anti-Xa level in 6 hours and daily while on heparin Continue to monitor H&H and platelets  Hanaa Payes A Sheehan Stacey 02/06/2022,11:13 AM

## 2022-02-07 ENCOUNTER — Other Ambulatory Visit: Payer: Self-pay

## 2022-02-07 ENCOUNTER — Encounter: Payer: Self-pay | Admitting: Cardiovascular Disease

## 2022-02-07 DIAGNOSIS — F17200 Nicotine dependence, unspecified, uncomplicated: Secondary | ICD-10-CM

## 2022-02-07 DIAGNOSIS — Z955 Presence of coronary angioplasty implant and graft: Secondary | ICD-10-CM

## 2022-02-07 DIAGNOSIS — I25119 Atherosclerotic heart disease of native coronary artery with unspecified angina pectoris: Secondary | ICD-10-CM

## 2022-02-07 DIAGNOSIS — E785 Hyperlipidemia, unspecified: Secondary | ICD-10-CM

## 2022-02-07 LAB — BASIC METABOLIC PANEL
Anion gap: 6 (ref 5–15)
BUN: 12 mg/dL (ref 6–20)
CO2: 26 mmol/L (ref 22–32)
Calcium: 9.5 mg/dL (ref 8.9–10.3)
Chloride: 107 mmol/L (ref 98–111)
Creatinine, Ser: 0.85 mg/dL (ref 0.61–1.24)
GFR, Estimated: >60 mL/min (ref >60)
Glucose, Bld: 94 mg/dL (ref 70–99)
Potassium: 4.4 mmol/L (ref 3.5–5.1)
Sodium: 139 mmol/L (ref 135–145)

## 2022-02-07 LAB — CBC
HCT: 40.5 % (ref 39.0–52.0)
Hemoglobin: 13.8 g/dL (ref 13.0–17.0)
MCH: 29.9 pg (ref 26.0–34.0)
MCHC: 34.1 g/dL (ref 30.0–36.0)
MCV: 87.7 fL (ref 80.0–100.0)
Platelets: 299 10*3/uL (ref 150–400)
RBC: 4.62 MIL/uL (ref 4.22–5.81)
RDW: 12 % (ref 11.5–15.5)
WBC: 7.8 10*3/uL (ref 4.0–10.5)
nRBC: 0 % (ref 0.0–0.2)

## 2022-02-07 LAB — LIPID PANEL
Cholesterol: 279 mg/dL — ABNORMAL HIGH (ref 0–200)
HDL: 27 mg/dL — ABNORMAL LOW (ref >40)
LDL Cholesterol: 193 mg/dL — ABNORMAL HIGH (ref 0–99)
Total CHOL/HDL Ratio: 10.3 RATIO
Triglycerides: 297 mg/dL — ABNORMAL HIGH (ref <150)
VLDL: 59 mg/dL — ABNORMAL HIGH (ref 0–40)

## 2022-02-07 LAB — POCT ACTIVATED CLOTTING TIME
Activated Clotting Time: 269 seconds
Activated Clotting Time: 347 seconds

## 2022-02-07 LAB — HIV ANTIBODY (ROUTINE TESTING W REFLEX): HIV Screen 4th Generation wRfx: NONREACTIVE

## 2022-02-07 MED ORDER — ASPIRIN 81 MG PO TBEC
81.0000 mg | DELAYED_RELEASE_TABLET | Freq: Every day | ORAL | 3 refills | Status: DC
  Filled 2022-02-07: qty 30, 30d supply, fill #0

## 2022-02-07 MED ORDER — ATORVASTATIN CALCIUM 80 MG PO TABS
80.0000 mg | ORAL_TABLET | Freq: Every day | ORAL | 3 refills | Status: DC
  Filled 2022-02-07: qty 30, 30d supply, fill #0

## 2022-02-07 MED ORDER — TICAGRELOR 90 MG PO TABS
90.0000 mg | ORAL_TABLET | Freq: Two times a day (BID) | ORAL | 0 refills | Status: DC
  Filled 2022-02-07: qty 60, 30d supply, fill #0

## 2022-02-07 MED ORDER — IOHEXOL 300 MG/ML  SOLN
INTRAMUSCULAR | Status: DC | PRN
  Administered 2022-02-06: 145 mL

## 2022-02-07 MED ORDER — NITROGLYCERIN 0.4 MG SL SUBL
0.4000 mg | SUBLINGUAL_TABLET | SUBLINGUAL | 3 refills | Status: DC | PRN
Start: 2022-02-07 — End: 2022-09-10
  Filled 2022-02-07: qty 25, 5d supply, fill #0

## 2022-02-07 NOTE — Hospital Course (Signed)
Walter Duncan is a 26 y.o. male with no significant past medical history except for nicotine dependence who presents to the ER for the second time in 1 week for evaluation of chest pain.  Patient states that the pain is mostly over the right anterior chest wall.  It is a constant pain and is rated 6 x 10 in intensity at its worst.  Pain is not related to rest or exertion and radiates to his abdomen.  He denies having any nausea or vomiting.  Pain is worse on inspiration.  He denies having any fever, no chills, no cough, no shortness of breath or diaphoresis associated with the pain. He was seen in the ED 5 days prior to admission and had slightly elevated troponin levels with a normal EKG.  His symptoms resolved with antacids that he received in the ER and he was discharged home with diagnosis of GERD. He denies having any changes in bowel habits, no urinary symptoms, no dizziness, no lightheadedness, no headache, no blurred vision or focal deficit. He has a family history of coronary artery disease, his uncle had an acute MI His troponin is elevated at 7262 Twelve-lead EKG shows normal sinus rhythm with sinus arrhythmia CT angiogram shows no acute cardiopulmonary disease and no evidence of acute pulmonary emboli.  Atherosclerotic coronary artery disease.  Age advanced coronary artery atherosclerosis

## 2022-02-07 NOTE — Progress Notes (Signed)
Progress Note  Patient Name: Walter Duncan Date of Encounter: 02/07/2022  [facility] Cardiologist: Kirke Corin  Subjective   He is feeling better with no chest pain or shortness of breath.  Inpatient Medications    Scheduled Meds:  aspirin EC  81 mg Oral Daily   atorvastatin  80 mg Oral Daily   sodium chloride flush  3 mL Intravenous Q12H   ticagrelor  90 mg Oral BID   Continuous Infusions:  sodium chloride     PRN Meds: sodium chloride, acetaminophen, iohexol, nitroGLYCERIN, ondansetron (ZOFRAN) IV, sodium chloride flush   Vital Signs    Vitals:   02/06/22 2306 02/07/22 0419 02/07/22 0731 02/07/22 1122  BP: 113/66 110/61 129/64 110/70  Pulse: (!) 58 (!) 57 62 (!) 59  Resp: 16 16 19 19   Temp: 98 F (36.7 C) 97.6 F (36.4 C) 98 F (36.7 C) 98 F (36.7 C)  TempSrc:      SpO2: 100% 100% 99% 99%  Weight:      Height:        Intake/Output Summary (Last 24 hours) at 02/07/2022 1339 Last data filed at 02/07/2022 1318 Gross per 24 hour  Intake 480 ml  Output 2150 ml  Net -1670 ml      02/06/2022   12:43 PM 02/06/2022    8:50 AM 02/01/2022    3:45 PM  Last 3 Weights  Weight (lbs) 145 lb 145 lb 144 lb 13.5 oz  Weight (kg) 65.772 kg 65.772 kg 65.7 kg      Telemetry    Normal sinus rhythm with sinus bradycardia and sinus arrhythmia- Personally Reviewed  ECG     - Personally Reviewed  Physical Exam   GEN: No acute distress.   Neck: No JVD Cardiac: RRR, no murmurs, rubs, or gallops.  Respiratory: Clear to auscultation bilaterally. GI: Soft, nontender, non-distended  MS: No edema; No deformity. Neuro:  Nonfocal  Psych: Normal affect  Small hematoma involving the right radial artery site.  Labs    High Sensitivity Troponin:   Recent Labs  Lab 02/01/22 1438 02/01/22 1640 02/06/22 0853 02/06/22 1110  TROPONINIHS 50* 77* 7,262* 8,768*     Chemistry Recent Labs  Lab 02/01/22 1438 02/01/22 1453 02/06/22 0853 02/07/22 0432  NA 137  --  140 139  K  3.9  --  4.6 4.4  CL 107  --  108 107  CO2 24  --  24 26  GLUCOSE 88  --  98 94  BUN 17  --  15 12  CREATININE 0.83  --  0.87 0.85  CALCIUM 9.2  --  9.9 9.5  PROT  --  7.1  --   --   ALBUMIN  --  4.1  --   --   AST  --  32  --   --   ALT  --  39  --   --   ALKPHOS  --  65  --   --   BILITOT  --  0.7  --   --   GFRNONAA >60  --  >60 >60  ANIONGAP 6  --  8 6    Lipids  Recent Labs  Lab 02/07/22 0432  CHOL 279*  TRIG 297*  HDL 27*  LDLCALC 193*  CHOLHDL 10.3    Hematology Recent Labs  Lab 02/01/22 1438 02/06/22 0853 02/07/22 0432  WBC 4.1 7.0 7.8  RBC 4.50 4.99 4.62  HGB 13.1 14.4 13.8  HCT 38.6* 43.7 40.5  MCV 85.8 87.6 87.7  MCH 29.1 28.9 29.9  MCHC 33.9 33.0 34.1  RDW 12.2 12.0 12.0  PLT 212 293 299   Thyroid No results for input(s): "TSH", "FREET4" in the last 168 hours.  BNPNo results for input(s): "BNP", "PROBNP" in the last 168 hours.  DDimer No results for input(s): "DDIMER" in the last 168 hours.   Radiology    CARDIAC CATHETERIZATION  Result Date: 02/06/2022   Mid RCA lesion is 30% stenosed.   RPDA lesion is 50% stenosed.   Prox LAD to Mid LAD lesion is 40% stenosed.   Mid LAD to Dist LAD lesion is 40% stenosed.   Ost Cx to Prox Cx lesion is 40% stenosed.   3rd Mrg lesion is 50% stenosed.   Mid Cx to Dist Cx lesion is 90% stenosed.   Mid LAD lesion is 80% stenosed.   A drug-eluting stent was successfully placed using a STENT ONYX FRONTIER [id number].   A drug-eluting stent was successfully placed using a STENT ONYX FRONTIER [id number].   Post intervention, there is a 0% residual stenosis.   Post intervention, there is a 0% residual stenosis.   The left ventricular systolic function is normal.   LV end diastolic pressure is mildly elevated.   The left ventricular ejection fraction is 55-65% by visual estimate. 1.  Significant two-vessel coronary artery disease.  The coronary arteries are surprisingly mildly calcified and diffusely diseased considering relatively young  age. 2.  Normal LV systolic function and mildly elevated left ventricular end-diastolic pressure. 3.  Successful angioplasty and drug-eluting stent placement to the mid left circumflex and mid left anterior descending artery. Recommendations: Dual antiplatelet therapy for at least 12 months. Aggressive treatment of risk factors. Smoking cessation and cardiac rehab.   ECHOCARDIOGRAM COMPLETE  Result Date: 02/06/2022    ECHOCARDIOGRAM REPORT   Patient Name:   Walter Duncan Date of Exam: 02/06/2022 Medical Rec #:  [id number]      Height:       65.0 in Accession #:    [id number]     Weight:       145.0 lb Date of Birth:  Aug 01, 1995      BSA:          1.725 m Patient Age:    26 years       BP:           128/71 mmHg Patient Gender: M              HR:           49 bpm. Exam Location:  [facility] Procedure: 2D Echo, Cardiac Doppler and Color Doppler Indications:     Chest pain R07.9  History:         Patient has no prior history of Echocardiogram examinations. No                  past medical history on file.  Sonographer:     Cristela Blue Referring Phys:  [id number] QMVHQION A Nahum Sherrer Diagnosing Phys: Lorine Bears MD IMPRESSIONS  1. Left ventricular ejection fraction, by estimation, is 55 to 60%. The left ventricle has normal function. The left ventricle has no regional wall motion abnormalities. Left ventricular diastolic parameters were normal.  2. Right ventricular systolic function is normal. The right ventricular size is normal. Tricuspid regurgitation signal is inadequate for assessing PA pressure.  3. The mitral valve is normal in structure. Mild mitral valve regurgitation. No evidence of mitral stenosis.  4.  The aortic valve is normal in structure. Aortic valve regurgitation is not visualized. No aortic stenosis is present.  5. The inferior vena cava is normal in size with greater than 50% respiratory variability, suggesting right atrial pressure of 3 mmHg. FINDINGS  Left Ventricle: Left ventricular ejection fraction, by  estimation, is 55 to 60%. The left ventricle has normal function. The left ventricle has no regional wall motion abnormalities. The left ventricular internal cavity size was normal in size. There is  no left ventricular hypertrophy. Left ventricular diastolic parameters were normal. Right Ventricle: The right ventricular size is normal. No increase in right ventricular wall thickness. Right ventricular systolic function is normal. Tricuspid regurgitation signal is inadequate for assessing PA pressure. Left Atrium: Left atrial size was normal in size. Right Atrium: Right atrial size was normal in size. Pericardium: There is no evidence of pericardial effusion. Mitral Valve: The mitral valve is normal in structure. Mild mitral valve regurgitation. No evidence of mitral valve stenosis. Tricuspid Valve: The tricuspid valve is normal in structure. Tricuspid valve regurgitation is not demonstrated. No evidence of tricuspid stenosis. Aortic Valve: The aortic valve is normal in structure. Aortic valve regurgitation is not visualized. No aortic stenosis is present. Aortic valve mean gradient measures 3.0 mmHg. Aortic valve peak gradient measures 5.3 mmHg. Aortic valve area, by VTI measures 1.77 cm. Pulmonic Valve: The pulmonic valve was normal in structure. Pulmonic valve regurgitation is trivial. No evidence of pulmonic stenosis. Aorta: The aortic root is normal in size and structure. Venous: The inferior vena cava is normal in size with greater than 50% respiratory variability, suggesting right atrial pressure of 3 mmHg. IAS/Shunts: No atrial level shunt detected by color flow Doppler.  LEFT VENTRICLE PLAX 2D LVIDd:         4.30 cm   Diastology LVIDs:         3.10 cm   LV e' medial:    10.30 cm/s LV PW:         0.90 cm   LV E/e' medial:  8.3 LV IVS:        0.80 cm   LV e' lateral:   14.80 cm/s LVOT diam:     2.00 cm   LV E/e' lateral: 5.8 LV SV:         42 LV SV Index:   24 LVOT Area:     3.14 cm  RIGHT VENTRICLE RV  Basal diam:  3.90 cm RV S prime:     10.70 cm/s TAPSE (M-mode): 2.0 cm LEFT ATRIUM             Index        RIGHT ATRIUM           Index LA diam:        3.10 cm 1.80 cm/m   RA Area:     17.40 cm LA Vol (A2C):   46.2 ml 26.78 ml/m  RA Volume:   51.70 ml  29.96 ml/m LA Vol (A4C):   38.1 ml 22.08 ml/m LA Biplane Vol: 44.8 ml 25.96 ml/m  AORTIC VALVE AV Area (Vmax):    1.92 cm AV Area (Vmean):   1.78 cm AV Area (VTI):     1.77 cm AV Vmax:           115.33 cm/s AV Vmean:          77.633 cm/s AV VTI:            0.237 m AV Peak Grad:  5.3 mmHg AV Mean Grad:      3.0 mmHg LVOT Vmax:         70.50 cm/s LVOT Vmean:        43.900 cm/s LVOT VTI:          0.134 m LVOT/AV VTI ratio: 0.56  AORTA Ao Root diam: 3.07 cm MITRAL VALVE               TRICUSPID VALVE MV Area (PHT): 3.83 cm    TR Peak grad:   11.2 mmHg MV Decel Time: 198 msec    TR Vmax:        167.00 cm/s MV E velocity: 85.30 cm/s MV A velocity: 45.00 cm/s  SHUNTS MV E/A ratio:  1.90        Systemic VTI:  0.13 m                            Systemic Diam: 2.00 cm Lorine Bears MD Electronically signed by Lorine Bears MD Signature Date/Time: 02/06/2022/1:12:24 PM    Final    CT Angio Chest PE W/Cm &/Or Wo Cm  Result Date: 02/06/2022 CLINICAL DATA:  Right-sided chest pain beginning yesterday morning. Suspect pulmonary embolism. EXAM: CT ANGIOGRAPHY CHEST WITH CONTRAST TECHNIQUE: Multidetector CT imaging of the chest was performed using the standard protocol during bolus administration of intravenous contrast. Multiplanar CT image reconstructions and MIPs were obtained to evaluate the vascular anatomy. RADIATION DOSE REDUCTION: This exam was performed according to the departmental dose-optimization program which includes automated exposure control, adjustment of the mA and/or kV according to patient size and/or use of iterative reconstruction technique. CONTRAST:  42mL OMNIPAQUE IOHEXOL 350 MG/ML SOLN COMPARISON:  Chest x-ray 02/06/2022 FINDINGS:  Cardiovascular: Heart is normal size. There is mild calcified plaque over the left anterior descending, lateral circumflex and right coronary arteries. Thoracic aorta is normal in caliber. Normal 3 vessel takeoff from the aortic arch. Pulmonary arterial system is well opacified without evidence of emboli. Mediastinum/Nodes: No mediastinal or hilar adenopathy. Lungs/Pleura: Lungs are adequately inflated without focal airspace consolidation or effusion. Airways are normal. Upper Abdomen: No acute findings. Musculoskeletal: No focal abnormality. Review of the MIP images confirms the above findings. IMPRESSION: 1. No acute cardiopulmonary disease and no evidence of acute pulmonary emboli. 2. Atherosclerotic coronary artery disease. Age advanced coronary artery atherosclerosis. Recommend assessment of coronary risk factors. Electronically Signed   By: Elberta Fortis M.D.   On: 02/06/2022 10:16   DG Chest 2 View  Result Date: 02/06/2022 CLINICAL DATA:  Chest pain. EXAM: CHEST - 2 VIEW COMPARISON:  Chest x-ray 02/01/2022. FINDINGS: The heart size and mediastinal contours are within normal limits. Both lungs are clear. No visible pleural effusions or pneumothorax. No acute osseous abnormality. IMPRESSION: No active cardiopulmonary disease. Electronically Signed   By: Feliberto Harts M.D.   On: 02/06/2022 09:10    Cardiac Studies   Echocardiogram showed normal LV systolic function.  Patient Profile     26 y.o. male with history of tobacco use and family history of premature coronary artery disease who presented with non-STEMI  Assessment & Plan    1. Non-ST elevation myocardial infarction: Cardiac catheterization was performed yesterday which showed significant two-vessel coronary artery disease involving the left circumflex and LAD.  The coronary arteries were surprisingly calcified and diffusely diseased.  I performed successful angioplasty and drug-eluting stent placement to both left circumflex and left  anterior descending artery.  The patient has  residual moderate disease that will be treated with aggressive medical therapy. Recommend dual antiplatelet therapy for 1 year. The patient will be provided with Brilinta card to supply for 1 month.  We will plan on switching him to generic clopidogrel upon follow-up. No beta-blocker is recommended due to baseline bradycardia. The patient was referred to cardiac rehab. He is to stay off work for 2 weeks. I had a prolonged discussion with him and his grandparents who are on the phone about the findings and the need for significant lifestyle changes and taking medications.  2.  Hyperlipidemia: Lipid profile showed severely elevated LDL at 193.  Possible familial hyperlipidemia.  The patient was started on high-dose atorvastatin.  I discussed with our research coordinator regarding enrolling him in hyperlipidemia trials to decrease his cardiovascular risk.  He is agreeable.  LP(a) is still pending.  3.  Tobacco use: I discussed the importance of smoking cessation.  I will arrange for follow-up visit in our office in 1 to 2 weeks.     For questions or updates, please contact [facility] Please consult [email or web address] for contact info under        Signed, Lorine Bears, MD  02/07/2022, 1:39 PM

## 2022-02-07 NOTE — Discharge Summary (Signed)
Physician Discharge Summary   Patient: Walter Duncan MRN: [id number] DOB: [birth year]  Admit date:     02/06/2022  Discharge date: 02/07/22  Discharge Physician: Edwin Dada   PCP: Patient, No Pcp Per     Recommendations at discharge:  Follow up with Cardiology, Dr. Fletcher Anon in 2-4 weeks     Discharge Diagnoses: Principal Problem:   NSTEMI (non-ST elevated myocardial infarction) [facility]) Active Problems:   Smoking   Dyslipidemia   Coronary artery disease involving native coronary artery of native heart with angina pectoris [facility])      Hospital Course: Walter Duncan is a 26 y.o. M with family history of premature cardiovascular disease who presented to the ER with substernal chest pain and shortness of breath.  In the ER, ECG was unremarkable but hsTrop >7000, so he was admitted on heparin.      * NSTEMI (non-ST elevated myocardial infarction) [facility]) Patient was admitted and underwent left heart cath that showed 90% stenosis of the mid circumflex and 80% stenosis of the mid LAD.  He had DES to both vessels.  Afterwards he was chest pain-free, observed overnight and was asymptomatic.  AHA/ACC Clinical Performance & Quality Measures: Aspirin prescribed? - Yes ADP Receptor Inhibitor (Plavix/Clopidogrel, Brilinta/Ticagrelor or Effient/Prasugrel) prescribed (includes medically managed patients)? - Yes Beta Blocker prescribed? - No - low baseline heart rate High Intensity Statin (Lipitor 40-80mg  or Crestor 20-40mg ) prescribed? - Yes EF assessed during THIS hospitalization? - Yes For EF <40%, was ACEI/ARB prescribed? - Not Applicable (EF >/= [id number]) For EF <40%, Aldosterone Antagonist (Spironolactone or Eplerenone) prescribed? - Not Applicable (EF >/= [id number]) Cardiac Rehab Phase II ordered (including medically managed patients)? - Yes      Smoking Cessation recommended  Hyperlipidemia LDL >190 mg/dL.  Started on high dose atorvastatin.  Has Cardiology follow up.            The [facility] Controlled Substances Registry was reviewed for this patient prior to discharge.   Consultants:  Cardiology Procedures performed:  - Echocardiogram - Left heart cath    Disposition: Home Diet recommendation:  Discharge Diet Orders (From admission, onward)     Start     Ordered   02/07/22 0000  Diet - low sodium heart healthy        02/07/22 1426             DISCHARGE MEDICATION: Allergies as of 02/07/2022   No Known Allergies      Medication List     STOP taking these medications    acyclovir 400 MG tablet Commonly known as: ZOVIRAX   aluminum-magnesium hydroxide-simethicone [id number] MG/5ML Susp Commonly known as: MAALOX       TAKE these medications    aspirin EC 81 MG tablet Take 1 tablet (81 mg total) by mouth daily. Swallow whole. Start taking on: February 08, 2022   atorvastatin 80 MG tablet Commonly known as: LIPITOR Take 1 tablet (80 mg total) by mouth daily. Start taking on: February 08, 2022   famotidine 20 MG tablet Commonly known as: PEPCID Take 1 tablet (20 mg total) by mouth 2 (two) times daily.   nitroGLYCERIN 0.4 MG SL tablet Commonly known as: NITROSTAT Place 1 tablet (0.4 mg total) under the tongue every 5 (five) minutes x 3 doses as needed for chest pain.   ticagrelor 90 MG Tabs tablet Commonly known as: BRILINTA Take 1 tablet (90 mg total) by mouth 2 (two) times daily.        Follow-up  Information     Iran Ouch, MD Follow up.   Specialty: Cardiology Contact information: [address] [place] [address] [phone]                 Discharge Instructions     AMB Referral to Cardiac Rehabilitation - Phase II   Complete by: As directed    Diagnosis:  NSTEMI Coronary Stents     After initial evaluation and assessments completed: Virtual Based Care may be provided alone or in conjunction with Phase 2 Cardiac Rehab based on patient barriers.: Yes   Diet -  low sodium heart healthy   Complete by: As directed    Discharge instructions   Complete by: As directed    From Dr. Maryfrances Bunnell and Dr. Kirke Corin: You were admitted to the hospital for a heart attack  You had two stents placed in the arteries of your heart, the left circumflex artery and the left anterior descending artery  You must take ticagrelor/Brilinta twice daily for the next 30 days Do not miss doses Do not stop this medicine  After that, Dr. Kirke Corin will switch you to clopidogrel/Plavix for the next year.    You should also take the cholesterol medicine atorvastatin/Lipitor 80 mg nightly  And you should take a low dose aspirin 81 mg daily  Follow up with Dr. Kirke Corin within 1 month, call his office to confirm the time of your appointment  If you develop chest pain again, you may take a subglingual nitroglycerin tablet If you have to take more than 3 of these in a row, call 9-1-1   Increase activity slowly   Complete by: As directed        Discharge Exam: Filed Weights   02/06/22 0850 02/06/22 1243  Weight: 65.8 kg 65.8 kg    General: Pt is alert, awake, not in acute distress Cardiovascular: RRR, nl S1-S2, no murmurs appreciated.   No LE edema.   Respiratory: Normal respiratory rate and rhythm.  CTAB without rales or wheezes. Abdominal: Abdomen soft and non-tender.  No distension or HSM.   Neuro/Psych: Strength symmetric in upper and lower extremities.  Judgment and insight appear normal.   Condition at discharge: good  The results of significant diagnostics from this hospitalization (including imaging, microbiology, ancillary and laboratory) are listed below for reference.   Imaging Studies: CARDIAC CATHETERIZATION  Result Date: 02/06/2022   Mid RCA lesion is 30% stenosed.   RPDA lesion is 50% stenosed.   Prox LAD to Mid LAD lesion is 40% stenosed.   Mid LAD to Dist LAD lesion is 40% stenosed.   Ost Cx to Prox Cx lesion is 40% stenosed.   3rd Mrg lesion is 50% stenosed.    Mid Cx to Dist Cx lesion is 90% stenosed.   Mid LAD lesion is 80% stenosed.   A drug-eluting stent was successfully placed using a STENT ONYX FRONTIER [id number].   A drug-eluting stent was successfully placed using a STENT ONYX FRONTIER [id number].   Post intervention, there is a 0% residual stenosis.   Post intervention, there is a 0% residual stenosis.   The left ventricular systolic function is normal.   LV end diastolic pressure is mildly elevated.   The left ventricular ejection fraction is 55-65% by visual estimate. 1.  Significant two-vessel coronary artery disease.  The coronary arteries are surprisingly mildly calcified and diffusely diseased considering relatively young age. 2.  Normal LV systolic function and mildly elevated left ventricular end-diastolic pressure.  3.  Successful angioplasty and drug-eluting stent placement to the mid left circumflex and mid left anterior descending artery. Recommendations: Dual antiplatelet therapy for at least 12 months. Aggressive treatment of risk factors. Smoking cessation and cardiac rehab.   ECHOCARDIOGRAM COMPLETE  Result Date: 02/06/2022    ECHOCARDIOGRAM REPORT   Patient Name:   ARTAVIS COWIE Date of Exam: 02/06/2022 Medical Rec #:  [id number]      Height:       65.0 in Accession #:    [id number]     Weight:       145.0 lb Date of Birth:  29-Sep-1995      BSA:          1.725 m Patient Age:    26 years       BP:           128/71 mmHg Patient Gender: M              HR:           49 bpm. Exam Location:  [facility] Procedure: 2D Echo, Cardiac Doppler and Color Doppler Indications:     Chest pain R07.9  History:         Patient has no prior history of Echocardiogram examinations. No                  past medical history on file.  Sonographer:     Cristela Blue Referring Phys:  [id number] DUKGURKY A ARIDA Diagnosing Phys: Lorine Bears MD IMPRESSIONS  1. Left ventricular ejection fraction, by estimation, is 55 to 60%. The left ventricle has normal function. The left ventricle has no  regional wall motion abnormalities. Left ventricular diastolic parameters were normal.  2. Right ventricular systolic function is normal. The right ventricular size is normal. Tricuspid regurgitation signal is inadequate for assessing PA pressure.  3. The mitral valve is normal in structure. Mild mitral valve regurgitation. No evidence of mitral stenosis.  4. The aortic valve is normal in structure. Aortic valve regurgitation is not visualized. No aortic stenosis is present.  5. The inferior vena cava is normal in size with greater than 50% respiratory variability, suggesting right atrial pressure of 3 mmHg. FINDINGS  Left Ventricle: Left ventricular ejection fraction, by estimation, is 55 to 60%. The left ventricle has normal function. The left ventricle has no regional wall motion abnormalities. The left ventricular internal cavity size was normal in size. There is  no left ventricular hypertrophy. Left ventricular diastolic parameters were normal. Right Ventricle: The right ventricular size is normal. No increase in right ventricular wall thickness. Right ventricular systolic function is normal. Tricuspid regurgitation signal is inadequate for assessing PA pressure. Left Atrium: Left atrial size was normal in size. Right Atrium: Right atrial size was normal in size. Pericardium: There is no evidence of pericardial effusion. Mitral Valve: The mitral valve is normal in structure. Mild mitral valve regurgitation. No evidence of mitral valve stenosis. Tricuspid Valve: The tricuspid valve is normal in structure. Tricuspid valve regurgitation is not demonstrated. No evidence of tricuspid stenosis. Aortic Valve: The aortic valve is normal in structure. Aortic valve regurgitation is not visualized. No aortic stenosis is present. Aortic valve mean gradient measures 3.0 mmHg. Aortic valve peak gradient measures 5.3 mmHg. Aortic valve area, by VTI measures 1.77 cm. Pulmonic Valve: The pulmonic valve was normal in  structure. Pulmonic valve regurgitation is trivial. No evidence of pulmonic stenosis. Aorta: The aortic root is normal in size and structure. Venous: The  inferior vena cava is normal in size with greater than 50% respiratory variability, suggesting right atrial pressure of 3 mmHg. IAS/Shunts: No atrial level shunt detected by color flow Doppler.  LEFT VENTRICLE PLAX 2D LVIDd:         4.30 cm   Diastology LVIDs:         3.10 cm   LV e' medial:    10.30 cm/s LV PW:         0.90 cm   LV E/e' medial:  8.3 LV IVS:        0.80 cm   LV e' lateral:   14.80 cm/s LVOT diam:     2.00 cm   LV E/e' lateral: 5.8 LV SV:         42 LV SV Index:   24 LVOT Area:     3.14 cm  RIGHT VENTRICLE RV Basal diam:  3.90 cm RV S prime:     10.70 cm/s TAPSE (M-mode): 2.0 cm LEFT ATRIUM             Index        RIGHT ATRIUM           Index LA diam:        3.10 cm 1.80 cm/m   RA Area:     17.40 cm LA Vol (A2C):   46.2 ml 26.78 ml/m  RA Volume:   51.70 ml  29.96 ml/m LA Vol (A4C):   38.1 ml 22.08 ml/m LA Biplane Vol: 44.8 ml 25.96 ml/m  AORTIC VALVE AV Area (Vmax):    1.92 cm AV Area (Vmean):   1.78 cm AV Area (VTI):     1.77 cm AV Vmax:           115.33 cm/s AV Vmean:          77.633 cm/s AV VTI:            0.237 m AV Peak Grad:      5.3 mmHg AV Mean Grad:      3.0 mmHg LVOT Vmax:         70.50 cm/s LVOT Vmean:        43.900 cm/s LVOT VTI:          0.134 m LVOT/AV VTI ratio: 0.56  AORTA Ao Root diam: 3.07 cm MITRAL VALVE               TRICUSPID VALVE MV Area (PHT): 3.83 cm    TR Peak grad:   11.2 mmHg MV Decel Time: 198 msec    TR Vmax:        167.00 cm/s MV E velocity: 85.30 cm/s MV A velocity: 45.00 cm/s  SHUNTS MV E/A ratio:  1.90        Systemic VTI:  0.13 m                            Systemic Diam: 2.00 cm Kathlyn Sacramento MD Electronically signed by Kathlyn Sacramento MD Signature Date/Time: 02/06/2022/1:12:24 PM    Final    CT Angio Chest PE W/Cm &/Or Wo Cm  Result Date: 02/06/2022 CLINICAL DATA:  Right-sided chest pain beginning  yesterday morning. Suspect pulmonary embolism. EXAM: CT ANGIOGRAPHY CHEST WITH CONTRAST TECHNIQUE: Multidetector CT imaging of the chest was performed using the standard protocol during bolus administration of intravenous contrast. Multiplanar CT image reconstructions and MIPs were obtained to evaluate the vascular anatomy. RADIATION DOSE REDUCTION: This exam was  performed according to the departmental dose-optimization program which includes automated exposure control, adjustment of the mA and/or kV according to patient size and/or use of iterative reconstruction technique. CONTRAST:  3mL OMNIPAQUE IOHEXOL 350 MG/ML SOLN COMPARISON:  Chest x-ray 02/06/2022 FINDINGS: Cardiovascular: Heart is normal size. There is mild calcified plaque over the left anterior descending, lateral circumflex and right coronary arteries. Thoracic aorta is normal in caliber. Normal 3 vessel takeoff from the aortic arch. Pulmonary arterial system is well opacified without evidence of emboli. Mediastinum/Nodes: No mediastinal or hilar adenopathy. Lungs/Pleura: Lungs are adequately inflated without focal airspace consolidation or effusion. Airways are normal. Upper Abdomen: No acute findings. Musculoskeletal: No focal abnormality. Review of the MIP images confirms the above findings. IMPRESSION: 1. No acute cardiopulmonary disease and no evidence of acute pulmonary emboli. 2. Atherosclerotic coronary artery disease. Age advanced coronary artery atherosclerosis. Recommend assessment of coronary risk factors. Electronically Signed   By: Elberta Fortis M.D.   On: 02/06/2022 10:16   DG Chest 2 View  Result Date: 02/06/2022 CLINICAL DATA:  Chest pain. EXAM: CHEST - 2 VIEW COMPARISON:  Chest x-ray 02/01/2022. FINDINGS: The heart size and mediastinal contours are within normal limits. Both lungs are clear. No visible pleural effusions or pneumothorax. No acute osseous abnormality. IMPRESSION: No active cardiopulmonary disease. Electronically  Signed   By: Feliberto Harts M.D.   On: 02/06/2022 09:10   DG Chest 2 View  Result Date: 02/01/2022 CLINICAL DATA:  Mid chest pain. EXAM: CHEST - 2 VIEW COMPARISON:  None Available. FINDINGS: The heart size and mediastinal contours are within normal limits. Both lungs are clear. The visualized skeletal structures are unremarkable. IMPRESSION: No active cardiopulmonary disease. Electronically Signed   By: Aram Candela M.D.   On: 02/01/2022 15:16    Microbiology: Results for orders placed or performed during the hospital encounter of 02/06/22  Resp Panel by RT-PCR (Flu A&B, Covid) Anterior Nasal Swab     Status: None   Collection Time: 02/06/22  9:46 AM   Specimen: Anterior Nasal Swab  Result Value Ref Range Status   SARS Coronavirus 2 by RT PCR NEGATIVE NEGATIVE Final    Comment: (NOTE) SARS-CoV-2 target nucleic acids are NOT DETECTED.  The SARS-CoV-2 RNA is generally detectable in upper respiratory specimens during the acute phase of infection. The lowest concentration of SARS-CoV-2 viral copies this assay can detect is 138 copies/mL. A negative result does not preclude SARS-Cov-2 infection and should not be used as the sole basis for treatment or other patient management decisions. A negative result may occur with  improper specimen collection/handling, submission of specimen other than nasopharyngeal swab, presence of viral mutation(s) within the areas targeted by this assay, and inadequate number of viral copies(<138 copies/mL). A negative result must be combined with clinical observations, patient history, and epidemiological information. The expected result is Negative.  Fact Sheet for Patients:  BloggerCourse.com  Fact Sheet for Healthcare Providers:  SeriousBroker.it  This test is no t yet approved or cleared by the [place] FDA and  has been authorized for detection and/or diagnosis of SARS-CoV-2 by FDA under an  Emergency Use Authorization (EUA). This EUA will remain  in effect (meaning this test can be used) for the duration of the COVID-19 declaration under Section 564(b)(1) of the Act, 21 U.S.C.section 360bbb-3(b)(1), unless the authorization is terminated  or revoked sooner.       Influenza A by PCR NEGATIVE NEGATIVE Final   Influenza B by PCR NEGATIVE NEGATIVE Final  Comment: (NOTE) The Xpert Xpress SARS-CoV-2/FLU/RSV plus assay is intended as an aid in the diagnosis of influenza from Nasopharyngeal swab specimens and should not be used as a sole basis for treatment. Nasal washings and aspirates are unacceptable for Xpert Xpress SARS-CoV-2/FLU/RSV testing.  Fact Sheet for Patients: EntrepreneurPulse.com.au  Fact Sheet for Healthcare Providers: IncredibleEmployment.be  This test is not yet approved or cleared by the [place] FDA and has been authorized for detection and/or diagnosis of SARS-CoV-2 by FDA under an Emergency Use Authorization (EUA). This EUA will remain in effect (meaning this test can be used) for the duration of the COVID-19 declaration under Section 564(b)(1) of the Act, 21 U.S.C. section 360bbb-3(b)(1), unless the authorization is terminated or revoked.  Performed at [facility], [address] [place], [place] [address]     Labs: CBC: Recent Labs  Lab 02/01/22 1438 02/06/22 0853 02/07/22 0432  WBC 4.1 7.0 7.8  HGB 13.1 14.4 13.8  HCT 38.6* 43.7 40.5  MCV 85.8 87.6 87.7  PLT 212 293 [id number]   Basic Metabolic Panel: Recent Labs  Lab 02/01/22 1438 02/06/22 0853 02/07/22 0432  NA 137 140 139  K 3.9 4.6 4.4  CL 107 108 107  CO2 24 24 26   GLUCOSE 88 98 94  BUN 17 15 12   CREATININE 0.83 0.87 0.85  CALCIUM 9.2 9.9 9.5   Liver Function Tests: Recent Labs  Lab 02/01/22 1453  AST 32  ALT 39  ALKPHOS 65  BILITOT 0.7  PROT 7.1  ALBUMIN 4.1   CBG: No results for input(s): "GLUCAP" in the  last 168 hours.  Discharge time spent: approximately 35 minutes spent on discharge counseling, evaluation of patient on day of discharge, and coordination of discharge planning with nursing, social work, pharmacy and case management  Signed: Edwin Dada, MD [facility] 02/07/2022

## 2022-02-07 NOTE — TOC Initial Note (Signed)
Transition of Care [facility]) - Initial/Assessment Note    Patient Details  Name: HALTON NEAS MRN: [id number] Date of Birth: 1995-11-09  [facility] CM/SW Contact:    Candie Chroman, LCSW Phone Number: 02/07/2022, 10:22 AM  Clinical Narrative:   CSW met with patient. No supports at bedside. CSW introduced role and inquired about patient not having a PCP which he confirmed. He also does not have insurance. Gave packet for free/low-cost healthcare in [facility] and intake paperwork for [employer]. No further concerns. CSW encouraged patient to contact CSW as needed. CSW will continue to follow patient for support and facilitate return home once stable. His dad or grandparents will transport him home depending on the time.               Expected Discharge Plan: Home/Self Care Barriers to Discharge: Continued Medical Work up   Patient Goals and CMS Choice        Expected Discharge Plan and Services Expected Discharge Plan: Home/Self Care     Post Acute Care Choice: NA Living arrangements for the past 2 months: Single Family Home                                      Prior Living Arrangements/Services Living arrangements for the past 2 months: Single Family Home   Patient language and need for interpreter reviewed:: Yes Do you feel safe going back to the place where you live?: Yes      Need for Family Participation in Patient Care: Yes (Comment)     Criminal Activity/Legal Involvement Pertinent to Current Situation/Hospitalization: No - Comment as needed  Activities of Daily Living Home Assistive Devices/Equipment: None ADL Screening (condition at time of admission) Patient's cognitive ability adequate to safely complete daily activities?: Yes Is the patient deaf or have difficulty hearing?: No Does the patient have difficulty seeing, even when wearing glasses/contacts?: No Does the patient have difficulty concentrating, remembering, or making  decisions?: No Patient able to express need for assistance with ADLs?: Yes Does the patient have difficulty dressing or bathing?: No Independently performs ADLs?: Yes (appropriate for developmental age) Does the patient have difficulty walking or climbing stairs?: No Weakness of Legs: None Weakness of Arms/Hands: None  Permission Sought/Granted                  Emotional Assessment Appearance:: Appears stated age Attitude/Demeanor/Rapport: Engaged, Gracious Affect (typically observed): Accepting, Appropriate, Calm, Pleasant Orientation: : Oriented to  Time, Oriented to Self, Oriented to Place, Oriented to Situation Alcohol / Substance Use: Not Applicable Psych Involvement: No (comment)  Admission diagnosis:  Elevated troponin [R77.8] NSTEMI (non-ST elevated myocardial infarction) ([place]) [I21.4] Nonspecific chest pain [R07.9] Acute myocarditis, unspecified myocarditis type [I40.9] Patient Active Problem List   Diagnosis Date Noted   Smoking 02/07/2022   Dyslipidemia 02/07/2022   Coronary artery disease involving native coronary artery of native heart with angina pectoris ([place]) 02/07/2022   NSTEMI (non-ST elevated myocardial infarction) ([place]) 02/06/2022   PCP:  Patient, No Pcp Per Pharmacy:   [id number] [place] [place] [address] Phone: [phone] Fax: [phone]  [id number] [place], [place] - [address] [place] [address] Phone: [phone] Fax: [phone]     Social Determinants of Health (SDOH) Interventions  Readmission Risk Interventions     No data to display

## 2022-02-08 ENCOUNTER — Encounter: Payer: Self-pay | Admitting: *Deleted

## 2022-02-08 ENCOUNTER — Other Ambulatory Visit: Payer: Self-pay

## 2022-02-08 DIAGNOSIS — Z006 Encounter for examination for normal comparison and control in clinical research program: Secondary | ICD-10-CM

## 2022-02-08 LAB — LIPOPROTEIN A (LPA): Lipoprotein (a): 83.8 nmol/L — ABNORMAL HIGH (ref <75.0)

## 2022-02-08 NOTE — Research (Signed)
Spoke to patient about EVOLVE study will send patient the consent to look over and he will call us back if he is interested I told him he had 10 days from his discharge date to decide if he wanted to participate in the study.     [place] Mianna Iezzi, Research Coordinator 02-08-2022   11:25 a.m.

## 2022-02-09 ENCOUNTER — Telehealth: Payer: Self-pay | Admitting: Cardiovascular Disease

## 2022-02-09 NOTE — Telephone Encounter (Signed)
Patient contacted regarding discharge from [facility] on 02/07/22.  Patient understands to follow up with provider Charlsie Quest, NP  on 02/21/22 at 2:45 pm at [facility] office. Patient understands discharge instructions? yes Patient understands medications and regiment? yes Patient understands to bring all medications to this visit? yes  Ask patient:  Are you enrolled in My Chart (yes or no)  If no ask patient if they would like to enroll.      For patients Patient states that he is doing well since discharge home and has no questions of concerns at this time.

## 2022-02-16 ENCOUNTER — Encounter: Payer: Self-pay | Admitting: *Deleted

## 2022-02-16 DIAGNOSIS — Z006 Encounter for examination for normal comparison and control in clinical research program: Secondary | ICD-10-CM

## 2022-02-16 NOTE — Research (Signed)
Called patient about research study never heard back from him after sending consent and information about the repatha.    [place] Romney Compean,research coordinator  02/14/2022

## 2022-02-21 ENCOUNTER — Encounter: Payer: Self-pay | Admitting: Cardiology

## 2022-02-21 ENCOUNTER — Ambulatory Visit (INDEPENDENT_AMBULATORY_CARE_PROVIDER_SITE_OTHER): Payer: Self-pay | Admitting: Cardiology

## 2022-02-21 VITALS — BP 112/64 | HR 66 | Ht 65.0 in | Wt 137.8 lb

## 2022-02-21 DIAGNOSIS — E782 Mixed hyperlipidemia: Secondary | ICD-10-CM

## 2022-02-21 DIAGNOSIS — I214 Non-ST elevation (NSTEMI) myocardial infarction: Secondary | ICD-10-CM

## 2022-02-21 DIAGNOSIS — Z720 Tobacco use: Secondary | ICD-10-CM

## 2022-02-21 DIAGNOSIS — I25119 Atherosclerotic heart disease of native coronary artery with unspecified angina pectoris: Secondary | ICD-10-CM

## 2022-02-21 NOTE — Patient Instructions (Addendum)
Medication Instructions:   Your physician recommends that you continue on your current medications as directed. Please refer to the Current Medication list given to you today.  *If you need a refill on your cardiac medications before your next appointment, please call your pharmacy*    Follow-Up: At [facility], you and your health needs are our priority.  As part of our continuing mission to provide you with exceptional heart care, we have created designated Provider Care Teams.  These Care Teams include your primary Cardiologist (physician) and Advanced Practice Providers (APPs -  Physician Assistants and Nurse Practitioners) who all work together to provide you with the care you need, when you need it.  We recommend signing up for the patient portal called "MyChart".  Sign up information is provided on this After Visit Summary.  MyChart is used to connect with patients for Virtual Visits (Telemedicine).  Patients are able to view lab/test results, encounter notes, upcoming appointments, etc.  Non-urgent messages can be sent to your provider as well.   To learn more about what you can do with MyChart, go to [email or web address].    Your next appointment:   6 week(s)  The format for your next appointment:   In Person  Provider:   You may see Lorine Bears, MD or one of the following Advanced Practice Providers on your designated Care Team:   - Charlsie Quest, NP   Other Instructions   Important Information About Sugar

## 2022-02-21 NOTE — Progress Notes (Signed)
Cardiology Clinic Note   Patient Name: Walter Duncan Date of Encounter: 02/21/2022  Primary Care Provider:  Patient, No Pcp Per Primary Cardiologist:  Lorine Bears, MD  Patient Profile    26 year old male with a past medical history of tobacco abuse and coronary artery disease, who is here for hospital follow-up of a recent NSTEMI.  Past Medical History    History reviewed. No pertinent past medical history. Past Surgical History:  Procedure Laterality Date   CORONARY STENT INTERVENTION N/A 02/06/2022   Procedure: CORONARY STENT INTERVENTION;  Surgeon: Iran Ouch, MD;  Location: [facility] INVASIVE CV LAB;  Service: Cardiovascular;  Laterality: N/A;   LEFT HEART CATH AND CORONARY ANGIOGRAPHY N/A 02/06/2022   Procedure: LEFT HEART CATH AND CORONARY ANGIOGRAPHY;  Surgeon: Iran Ouch, MD;  Location: [facility] INVASIVE CV LAB;  Service: Cardiovascular;  Laterality: N/A;    Allergies  No Known Allergies  History of Present Illness    25 year old male with no prior cardiac history who had a known history of tobacco use and smokes one third of a pack per day.  He had strong family history of premature coronary artery disease as his uncle died of heart disease and stroke in his 67s.  He denied any recreational drug use and did not take any prescriptions medication.  He presented to the [facility] emergency department on 02/06/22 for chest pain and was treated for GERD, however there was no improvement in his symptoms and he started feeling worse.  He had had chest discomfort that he described as substernal right-sided chest pain that seems to be constant but associated with significant shortness of breath.  He was found to have significantly elevated troponin at 7000, CT of the chest showed no evidence of pulmonary embolism or aortic dissection, incidentally significant coronary calcifications were noted especially in the LAD distribution.  On 02/06/2022 he underwent left heart catheterization  which revealed significant two-vessel coronary artery disease.  Coronary arteries was probably mildly calcified and diffusely diseased considered in his young age.  Normal LV systolic function with mildly elevated left ventricular end-diastolic pressure.  Successful angioplasty and drug-eluting stent placement to the mid left circumflex and mid left anterior descending artery.  Recommendations at that time with dual antiplatelet therapy for at least 12 months, aggressive treatment of risk factors, smoking cessation, and cardiac rehab.  His hospital cessation he also underwent an echocardiogram which revealed an LVEF of 55 to 60%, no regional wall motion abnormalities, and mild mitral valve regurgitation.  He returns to clinic today stating that he is feeling well. He did take 1 Nitrostat last weekend for chest pressure after being in the heat. Since that time he has had no episodes. Today denies any chest pain, shortness of breath, or right wrist pain. States that he has been compliant with his medication regimen. Also has continued to not smoke since his hospitalization.   Home Medications    Current Outpatient Medications  Medication Sig Dispense Refill   aspirin EC 81 MG tablet Take 1 tablet (81 mg total) by mouth daily. Swallow whole. 30 tablet 3   atorvastatin (LIPITOR) 80 MG tablet Take 1 tablet (80 mg total) by mouth daily. 30 tablet 3   famotidine (PEPCID) 20 MG tablet Take 1 tablet (20 mg total) by mouth 2 (two) times daily. 60 tablet 0   nitroGLYCERIN (NITROSTAT) 0.4 MG SL tablet Place 1 tablet (0.4 mg total) under the tongue every 5 (five) minutes x 3 doses as  needed for chest pain. 25 tablet 3   ticagrelor (BRILINTA) 90 MG TABS tablet Take 1 tablet (90 mg total) by mouth 2 (two) times daily. 60 tablet 0   No current facility-administered medications for this visit.     Family History    History reviewed. No pertinent family history. has no family status information on file.   Social  History    Social History   Socioeconomic History   Marital status: Single    Spouse name: Not on file   Number of children: Not on file   Years of education: Not on file   Highest education level: Not on file  Occupational History   Not on file  Tobacco Use   Smoking status: Former    Packs/day: 0.50    Types: Cigarettes    Quit date: 02/07/2022    Years since quitting: 0.0   Smokeless tobacco: Never   Tobacco comments:    Quit 02/07/22  Vaping Use   Vaping Use: Some days  Substance and Sexual Activity   Alcohol use: Not Currently    Comment: last was 01/02/22   Drug use: Never   Sexual activity: Not on file  Other Topics Concern   Not on file  Social History Narrative   Not on file   Social Determinants of Health   Financial Resource Strain: Not on file  Food Insecurity: Not on file  Transportation Needs: Not on file  Physical Activity: Not on file  Stress: Not on file  Social Connections: Not on file  Intimate Partner Violence: Not on file     Review of Systems    General:  No chills, fever, night sweats or weight changes.  Cardiovascular:  No chest pain, dyspnea on exertion, edema, orthopnea, palpitations, paroxysmal nocturnal dyspnea. Dermatological: No rash, lesions/masses Respiratory: No cough, dyspnea Urologic: No hematuria, dysuria Abdominal:   No nausea, vomiting, diarrhea, bright red blood per rectum, melena, or hematemesis Neurologic:  No visual changes, wkns, changes in mental status. All other systems reviewed and are otherwise negative except as noted above.   Physical Exam    VS:  BP 112/64 (BP Location: Left Arm, Patient Position: Sitting, Cuff Size: Normal)   Pulse 66   Ht 5\' 5"  (1.651 m)   Wt 137 lb 12.8 oz (62.5 kg)   SpO2 98%   BMI 22.93 kg/m  , BMI Body mass index is 22.93 kg/m.     GEN: Well nourished, well developed, in no acute distress. HEENT: normal. Neck: Supple, no JVD, carotid bruits, or masses. Cardiac: RRR, no murmurs,  rubs, or gallops. No clubbing, cyanosis, edema.  Radials/DP/PT 2+ and equal bilaterally. Right wrist cath site with bruising that is fading. No pain to site with palpation. Respiratory:  Respirations regular and unlabored, clear to auscultation bilaterally. GI: Soft, nontender, nondistended, BS + x 4. MS: no deformity or atrophy. Skin: warm and dry, no rash. Neuro:  Strength and sensation are intact. Psych: Normal affect.  Accessory Clinical Findings    ECG personally reviewed by me today- sinus with sinus arrhythmia rate of 66 with LVH, and peaked T waves - No acute changes  Lab Results  Component Value Date   WBC 7.8 02/07/2022   HGB 13.8 02/07/2022   HCT 40.5 02/07/2022   MCV 87.7 02/07/2022   PLT 299 02/07/2022   Lab Results  Component Value Date   CREATININE 0.85 02/07/2022   BUN 12 02/07/2022   NA 139 02/07/2022   K 4.4 02/07/2022  CL 107 02/07/2022   CO2 26 02/07/2022   Lab Results  Component Value Date   ALT 39 02/01/2022   AST 32 02/01/2022   ALKPHOS 65 02/01/2022   BILITOT 0.7 02/01/2022   Lab Results  Component Value Date   CHOL 279 (H) 02/07/2022   HDL 27 (L) 02/07/2022   LDLCALC 193 (H) 02/07/2022   TRIG 297 (H) 02/07/2022   CHOLHDL 10.3 02/07/2022    No results found for: "HGBA1C"  Assessment & Plan   1.  Coronary artery disease status post NSTEMI which revealed significant two-vessel coronary artery disease involving the left circumflex and mid LAD.  Coronary arteries was present calcified and diffusely diseased he underwent successful angioplasty with DES placed in both the left circumflex and left anterior descending artery.  He was recommended at that time with dual antiplatelet therapy for minimum of 1 year with Brilinta and aspirin.  He was not started on beta-blocker due to bradycardia.  He was started on Lipitor 80 mg daily.  He will be given a note to return to work today.  At this point time he is tentatively declined cardiac rehab due to  financial concerns.  He is filling out patient assistance form for [place] for his Brilinta he does not qualify for assistance with his medications we can change him to clopidogrel on his return appointment.  2.  Hyperlipidemia with his last LDL of 193.  He does have possible familial hyperlipidemia at this point.  With recent diagnoses of NSTEMI and stent placement x2 he was started on high-dose atorvastatin.  He is also discussed the involved study with research department.  Will need follow-up lipid and LFTs on return appointment  3.  Tobacco abuse with continued discussion of continued smoking cessation.  Patient states that he is still smoking since the day that he was admitted into the hospital and is continue to be without or vaping at this time patient was congratulated on continuing to smoke.  4.  Disposition patient return to clinic in 6 weeks to see MD/APP.  Rolin Schult, NP 02/21/2022, 3:44 PM

## 2022-02-22 ENCOUNTER — Other Ambulatory Visit: Payer: Self-pay

## 2022-03-02 ENCOUNTER — Telehealth: Payer: Self-pay | Admitting: Cardiology

## 2022-03-02 NOTE — Telephone Encounter (Signed)
Forms/ RX received from Charlsie Quest, NP that were originally sent over for her from the [facility] at [facility] for Assistance for [place].  I have faxed the completed forms/ RX to Willeen Niece, [facility] at [phone]. Fax confirmation received.   Originals placed at the front desk for scanning.

## 2022-03-07 ENCOUNTER — Other Ambulatory Visit: Payer: Self-pay | Admitting: Cardiovascular Disease

## 2022-03-07 ENCOUNTER — Other Ambulatory Visit: Payer: Self-pay | Admitting: Orthopedic Surgery

## 2022-03-07 ENCOUNTER — Other Ambulatory Visit: Payer: Self-pay

## 2022-03-07 DIAGNOSIS — M25532 Pain in left wrist: Secondary | ICD-10-CM

## 2022-03-07 MED ORDER — ASPIRIN 81 MG PO TBEC
81.0000 mg | DELAYED_RELEASE_TABLET | Freq: Every day | ORAL | 0 refills | Status: DC
  Filled 2022-03-07: qty 30, 30d supply, fill #0

## 2022-03-07 MED ORDER — ATORVASTATIN CALCIUM 80 MG PO TABS
80.0000 mg | ORAL_TABLET | Freq: Every day | ORAL | 0 refills | Status: DC
  Filled 2022-03-07 (×2): qty 30, 30d supply, fill #0

## 2022-03-07 NOTE — Telephone Encounter (Signed)
Refills forwarded to [facility] employee pharmacy.

## 2022-03-07 NOTE — Telephone Encounter (Signed)
 *  STAT* If patient is at the pharmacy, call can be transferred to refill team.   1. Which medications need to be refilled? (please list name of each medication and dose if known) aspirin EC 81 MG tablet atorvastatin (LIPITOR) 80 MG tablet  2. Which pharmacy/location (including street and city if local pharmacy) is medication to be sent to? [facility]  3. Do they need a 30 day or 90 day supply? 30 days

## 2022-03-08 ENCOUNTER — Other Ambulatory Visit: Payer: Self-pay

## 2022-03-19 ENCOUNTER — Other Ambulatory Visit: Payer: Self-pay

## 2022-03-19 ENCOUNTER — Telehealth: Payer: Self-pay | Admitting: Cardiovascular Disease

## 2022-03-19 MED ORDER — TICAGRELOR 90 MG PO TABS
90.0000 mg | ORAL_TABLET | Freq: Two times a day (BID) | ORAL | 0 refills | Status: DC
  Filled 2022-03-19: qty 60, 30d supply, fill #0

## 2022-03-19 NOTE — Telephone Encounter (Signed)
*  STAT* If patient is at the pharmacy, call can be transferred to refill team.   1. Which medications need to be refilled? (please list name of each medication and dose if known)  Brilinta  2. Which pharmacy/location (including street and city if local pharmacy) is medication to be sent to?[facility]  3. Do they need a 30 day or 90 day supply? 30 days and refills

## 2022-03-19 NOTE — Telephone Encounter (Signed)
No answer. No voicemail. 

## 2022-03-19 NOTE — Telephone Encounter (Signed)
Spoke with patient and he reports only having one dose tonight and then one tomorrow. Will forward to provider for review and recommendations.

## 2022-03-19 NOTE — Telephone Encounter (Signed)
Requested Prescriptions   Signed Prescriptions Disp Refills   ticagrelor (BRILINTA) 90 MG TABS tablet 60 tablet 0    Sig: Take 1 tablet (90 mg total) by mouth 2 (two) times daily.    Authorizing Provider: Kathlyn Sacramento A    Ordering User: Raelene Bott, Breiona Couvillon L   Did not give refills at this time due to possible med change at upcoming appt on 04/05/22.

## 2022-03-20 ENCOUNTER — Ambulatory Visit
Admission: RE | Admit: 2022-03-20 | Discharge: 2022-03-20 | Disposition: A | Discharge status: Home / Self Care | Payer: PRIVATE HEALTH INSURANCE | Source: Ambulatory Visit | Attending: Orthopedic Surgery | Admitting: Orthopedic Surgery

## 2022-03-20 ENCOUNTER — Ambulatory Visit
Admission: RE | Admit: 2022-03-20 | Discharge: 2022-03-20 | Disposition: A | Discharge status: Home / Self Care | Payer: Medicaid Other | Source: Ambulatory Visit | Attending: Orthopedic Surgery | Admitting: Orthopedic Surgery

## 2022-03-20 DIAGNOSIS — M25532 Pain in left wrist: Secondary | ICD-10-CM

## 2022-03-20 MED ORDER — IOPAMIDOL (ISOVUE-M 200) INJECTION 41%
4.0000 mL | Freq: Once | INTRAMUSCULAR | Status: AC
  Administered 2022-03-20: 4 mL via INTRA_ARTICULAR

## 2022-03-21 MED ORDER — CLOPIDOGREL BISULFATE 75 MG PO TABS: 75.0000 mg | ORAL_TABLET | Freq: Every day | ORAL | 3 refills | Status: DC

## 2022-03-21 NOTE — Telephone Encounter (Signed)
Spoke with patient and reviewed cost of medication. He has not been able to get prescription so reviewed cost of clopidogrel. He was agreeable with that medication with no further questions at this time.    Gerrie Nordmann, NP  Valora Corporal, RN Caller: Unspecified (2 days ago,  9:42 AM) I think they are having issues with patient assistance for the brilinta. If we do not have samples we can change him to plavix 75mg  daily, which will be a lot cheaper for him

## 2022-03-21 NOTE — Addendum Note (Signed)
Addended by: Valora Corporal on: 03/21/2022 02:48 PM   Modules accepted: Orders

## 2022-03-26 ENCOUNTER — Telehealth: Payer: Self-pay | Admitting: Cardiology

## 2022-03-26 ENCOUNTER — Telehealth: Payer: Self-pay | Admitting: *Deleted

## 2022-03-26 MED ORDER — ASPIRIN 81 MG PO TBEC: 81.0000 mg | DELAYED_RELEASE_TABLET | Freq: Every day | ORAL | 0 refills | Status: AC

## 2022-03-26 NOTE — Telephone Encounter (Signed)
Patient had called wanting to know if his prescription had been sent in. Reviewed that [facility] [address]. And he confirmed that it was there. He did state that it was cheaper over at [facility] and they are transferring his prescriptions. He was appreciative for the call with no further questions at this time.

## 2022-03-26 NOTE — Telephone Encounter (Signed)
Requested Prescriptions   Signed Prescriptions Disp Refills   aspirin EC 81 MG tablet 90 tablet 0    Sig: Take 1 tablet (81 mg total) by mouth daily. Swallow whole.    Authorizing Provider: HAMMOCK, SHERI    Ordering User: Raelene Bott, Renaud Celli L

## 2022-03-26 NOTE — Telephone Encounter (Signed)
*  STAT* If patient is at the pharmacy, call can be transferred to refill team.   1. Which medications need to be refilled? (please list name of each medication and dose if known)    aspirin EC 81 MG tablet    2. Which pharmacy/location (including street and city if local pharmacy) is medication to be sent to? [id number] [address], [place] - [address]  3. Do they need a 30 day or 90 day supply? [place]

## 2022-04-04 NOTE — Progress Notes (Unsigned)
Cardiology Clinic Note   Patient Name: Walter Duncan Date of Encounter: 04/05/2022  Primary Care Provider:  Patient, No Pcp Per Primary Cardiologist:  Lorine Bears, MD  Patient Profile    26 year old male with a past medical history of tobacco abuse and coronary artery disease, who is here for follow-up of his coronary artery disease.  Past Medical History    History reviewed. No pertinent past medical history. Past Surgical History:  Procedure Laterality Date   CORONARY STENT INTERVENTION N/A 02/06/2022   Procedure: CORONARY STENT INTERVENTION;  Surgeon: Iran Ouch, MD;  Location: [facility] INVASIVE CV LAB;  Service: Cardiovascular;  Laterality: N/A;   LEFT HEART CATH AND CORONARY ANGIOGRAPHY N/A 02/06/2022   Procedure: LEFT HEART CATH AND CORONARY ANGIOGRAPHY;  Surgeon: Iran Ouch, MD;  Location: [facility] INVASIVE CV LAB;  Service: Cardiovascular;  Laterality: N/A;    Allergies  No Known Allergies  History of Present Illness    Mr. Walter Duncan is a 26 year old male with no prior cardiac history who had a known history of tobacco use and smokes one third of a pack per day.  He had a strong family history of premature coronary artery disease as his uncle died of heart disease and stroke in his 39s.  He presents to the [facility] emergency department on 02/06/2022 for chest pain was treated for GERD, however there was no improvement in his symptoms and he started feeling worse.  He had had chest discomfort that he described as substernal right-sided chest pain he described as constant but associated with significant shortness of breath.  He was found to significantly have elevated high-sensitivity troponin at 7000, CT of the chest showed no evidence of pulmonary embolism or aortic dissection, incidentally significant coronary calcifications were noted especially in the LAD distribution.  On 02/06/2022 he underwent left heart catheterization which revealed significant two-vessel coronary  artery disease.  Coronary arteries were mildly calcified and diffusely diseased considering his young age.  Normal LV systolic function with mildly elevated left ventricular end-diastolic pressure.  He underwent successful angioplasty and drug-eluting stent placement to the mid left circumflex and mid left anterior descending artery.  Echocardiogram revealed LVEF of 55-60%, no regional wall motion abnormalities, and mild mitral valve regurgitation.  Prior to discharge he was recommended for at least 12 months of dual antiplatelet therapy, aggressive treatment of risk factors, smoking cessation, and cardiac rehab.  He returns to clinic today with complaints of chest pressure like he had prior to his NSTEMI.  He states primarily of the pressure, swelling he is exerting himself.  He states that he is alleviating factors of nitro he originally was taking 1 tablet now he is taken 2 with relief.  He denies any associated symptoms of shortness of breath, diaphoresis, nausea/vomiting, or palpitations.  States that he has been compliant with medication.  Denies any recent hospitalizations or visits to the emergency department.  Home Medications    Current Outpatient Medications  Medication Sig Dispense Refill   aspirin EC 81 MG tablet Take 1 tablet (81 mg total) by mouth daily. Swallow whole. 90 tablet 0   atorvastatin (LIPITOR) 80 MG tablet Take 1 tablet (80 mg total) by mouth daily. 30 tablet 0   clopidogrel (PLAVIX) 75 MG tablet Take 1 tablet (75 mg total) by mouth daily. 90 tablet 3   nitroGLYCERIN (NITROSTAT) 0.4 MG SL tablet Place 1 tablet (0.4 mg total) under the tongue every 5 (five) minutes x 3 doses as needed for  chest pain. 25 tablet 3   No current facility-administered medications for this visit.     Family History    History reviewed. No pertinent family history. has no family status information on file.   Social History    Social History   Socioeconomic History   Marital status: Single     Spouse name: Not on file   Number of children: Not on file   Years of education: Not on file   Highest education level: Not on file  Occupational History   Not on file  Tobacco Use   Smoking status: Former    Packs/day: 0.50    Types: Cigarettes    Quit date: 02/07/2022    Years since quitting: 0.1   Smokeless tobacco: Never   Tobacco comments:    Quit 02/07/22  Vaping Use   Vaping Use: Former  Substance and Sexual Activity   Alcohol use: Not Currently    Comment: last was 01/02/22   Drug use: Never   Sexual activity: Not on file  Other Topics Concern   Not on file  Social History Narrative   Not on file   Social Determinants of Health   Financial Resource Strain: Not on file  Food Insecurity: Not on file  Transportation Needs: Not on file  Physical Activity: Not on file  Stress: Not on file  Social Connections: Not on file  Intimate Partner Violence: Not on file     Review of Systems    General:  No chills, fever, night sweats or weight changes.  Cardiovascular: Endorses chest pain/chest pressure, but denies dyspnea on exertion, edema, orthopnea, palpitations, paroxysmal nocturnal dyspnea. Dermatological: No rash, lesions/masses Respiratory: No cough, dyspnea Urologic: No hematuria, dysuria Abdominal:   No nausea, vomiting, diarrhea, bright red blood per rectum, melena, or hematemesis Neurologic:  No visual changes, wkns, changes in mental status. All other systems reviewed and are otherwise negative except as noted above.   Physical Exam    VS:  BP 100/60 (BP Location: Left Arm, Patient Position: Sitting, Cuff Size: Normal)   Pulse (!) 50   Ht 5\' 5"  (1.651 m)   Wt 140 lb 2 oz (63.6 kg)   BMI 23.32 kg/m  , BMI Body mass index is 23.32 kg/m.     GEN: Well nourished, well developed, in no acute distress. HEENT: normal. Neck: Supple, no JVD, carotid bruits, or masses. Cardiac: RRR, no murmurs, rubs, or gallops. No clubbing, cyanosis, edema.  Radials/DP/PT 2+  and equal bilaterally.  Respiratory:  Respirations regular and unlabored, clear to auscultation bilaterally. GI: Soft, nontender, nondistended, BS + x 4. MS: no deformity or atrophy. Skin: warm and dry, no rash. Neuro:  Strength and sensation are intact. Psych: Normal affect.  Accessory Clinical Findings    ECG personally reviewed by me today-sinus bradycardia with a rate of 50 with LVH and early repolarization- No acute changes  Lab Results  Component Value Date   WBC 7.8 02/07/2022   HGB 13.8 02/07/2022   HCT 40.5 02/07/2022   MCV 87.7 02/07/2022   PLT 299 02/07/2022   Lab Results  Component Value Date   CREATININE 0.85 02/07/2022   BUN 12 02/07/2022   NA 139 02/07/2022   K 4.4 02/07/2022   CL 107 02/07/2022   CO2 26 02/07/2022   Lab Results  Component Value Date   ALT 39 02/01/2022   AST 32 02/01/2022   ALKPHOS 65 02/01/2022   BILITOT 0.7 02/01/2022   Lab Results  Component  Value Date   CHOL 279 (H) 02/07/2022   HDL 27 (L) 02/07/2022   LDLCALC 193 (H) 02/07/2022   TRIG 297 (H) 02/07/2022   CHOLHDL 10.3 02/07/2022    No results found for: "HGBA1C"  Assessment & Plan   1.  Coronary artery disease status post recent NSTEMI which revealed significant two-vessel coronary artery disease involving the left circumflex and mid LAD.  Coronary arteries were calcified and diffusely diseased he underwent successful angioplasty with DES placed in both the left circumflex and LAD.  Unfortunately he returns today complaining of chest pressure similar to the chest pressure he had on his arrival to the emergency department he states that he has required the use of Nitrostat to relieve the chest pressure that he has been experiencing but only had 2 doses.  He has been encouraged to daily be asked to take a third dose that he should report to the emergency department.  He has been continued on aspirin 81 mg daily, atorvastatin 80 mg daily, clopidogrel 75 mg daily.  He is also being on  scheduled for stress test.  2.  Hyperlipidemia with his last LDL from 193 on 02/07/2022.  He is continued on atorvastatin 80 mg daily which he requires refill of today with request for being sent to [facility].  He will need a repeat lipid panel next month as well.  3. Tobacco abuse for patient who states he has still not smoking since the day he was admitted to the hospital.  He has been congratulated on his smoking cessation and encouraged to continue on that path.  4. Disposition patient return to clinic to see MD/APP after stress testing is completed or sooner if needed.  Patient was advised to return to the emergency department if he requires a sensory Nitrostat to relieve his chest pressure.  Harley Fitzwater, NP 04/05/2022, 9:45 AM

## 2022-04-05 ENCOUNTER — Ambulatory Visit: Payer: Self-pay | Attending: Cardiology | Admitting: Cardiology

## 2022-04-05 ENCOUNTER — Encounter: Payer: Self-pay | Admitting: Cardiology

## 2022-04-05 VITALS — BP 100/60 | HR 50 | Ht 65.0 in | Wt 140.1 lb

## 2022-04-05 DIAGNOSIS — I25119 Atherosclerotic heart disease of native coronary artery with unspecified angina pectoris: Secondary | ICD-10-CM

## 2022-04-05 DIAGNOSIS — E782 Mixed hyperlipidemia: Secondary | ICD-10-CM

## 2022-04-05 DIAGNOSIS — R079 Chest pain, unspecified: Secondary | ICD-10-CM

## 2022-04-05 MED ORDER — ATORVASTATIN CALCIUM 80 MG PO TABS: 80.0000 mg | ORAL_TABLET | Freq: Every day | ORAL | 3 refills | Status: DC

## 2022-04-05 NOTE — Patient Instructions (Signed)
Medication Instructions:   Your physician recommends that you continue on your current medications as directed. Please refer to the Current Medication list given to you today.  *If you need a refill on your cardiac medications before your next appointment, please call your pharmacy*   Lab Work:  None ordered  If you have labs (blood work) drawn today and your tests are completely normal, you will receive your results only by: [place] (if you have MyChart) OR A paper copy in the mail If you have any lab test that is abnormal or we need to change your treatment, we will call you to review the results.   Testing/Procedures:  [place]  Your caregiver has ordered a Stress Test with nuclear imaging. The purpose of this test is to evaluate the blood supply to your heart muscle. This procedure is referred to as a "Non-Invasive Stress Test." This is because other than having an IV started in your vein, nothing is inserted or "invades" your body. Cardiac stress tests are done to find areas of poor blood flow to the heart by determining the extent of coronary artery disease (CAD). Some patients exercise on a treadmill, which naturally increases the blood flow to your heart, while others who are  unable to walk on a treadmill due to physical limitations have a pharmacologic/chemical stress agent called Lexiscan . This medicine will mimic walking on a treadmill by temporarily increasing your coronary blood flow.   Please note: these test may take anywhere between 2-4 hours to complete  PLEASE REPORT TO [place] AT THE FIRST DESK WILL DIRECT YOU WHERE TO GO  Date of Procedure:_____________________________________  Arrival Time for Procedure:______________________________ PLEASE NOTIFY THE OFFICE AT LEAST 24 HOURS IN ADVANCE IF YOU ARE UNABLE TO KEEP YOUR APPOINTMENT.  [phone] AND  PLEASE NOTIFY NUCLEAR MEDICINE AT [facility] AT LEAST 24 HOURS IN ADVANCE  IF YOU ARE UNABLE TO KEEP YOUR APPOINTMENT. [phone]  How to prepare for your Myoview test:  Do not eat or drink after midnight No caffeine for 24 hours prior to test No smoking 24 hours prior to test. Your medication may be taken with water.  If your doctor stopped a medication because of this test, do not take that medication. Ladies, please do not wear dresses.  Skirts or pants are appropriate. Please wear a short sleeve shirt. No perfume, cologne or lotion. Wear comfortable walking shoes. No heels!   Follow-Up: At [facility], you and your health needs are our priority.  As part of our continuing mission to provide you with exceptional heart care, we have created designated Provider Care Teams.  These Care Teams include your primary Cardiologist (physician) and Advanced Practice Providers (APPs -  Physician Assistants and Nurse Practitioners) who all work together to provide you with the care you need, when you need it.  We recommend signing up for the patient portal called "MyChart".  Sign up information is provided on this After Visit Summary.  MyChart is used to connect with patients for Virtual Visits (Telemedicine).  Patients are able to view lab/test results, encounter notes, upcoming appointments, etc.  Non-urgent messages can be sent to your provider as well.   To learn more about what you can do with MyChart, go to [email or web address].    Your next appointment:  After Myoview  The format for your next appointment:   In Person  Provider:   You may see Kathlyn Sacramento, MD or one of the  following Advanced Practice Providers on your designated Care Team:   Nicolasa Ducking, NP Eula Listen, PA-C Cadence Fransico Michael, PA-C Charlsie Quest, NP

## 2022-04-11 ENCOUNTER — Encounter
Admission: RE | Admit: 2022-04-11 | Discharge: 2022-04-11 | Disposition: A | Discharge status: Home / Self Care | Payer: Medicaid Other | Source: Ambulatory Visit | Attending: Cardiology | Admitting: Cardiology

## 2022-04-11 DIAGNOSIS — R079 Chest pain, unspecified: Secondary | ICD-10-CM | POA: Insufficient documentation

## 2022-04-11 LAB — NM MYOCAR MULTI W/SPECT W/WALL MOTION / EF
LV dias vol: 85 mL (ref 62–150)
LV sys vol: 41 mL
Nuc Stress EF: 52 %
Peak HR: 121 {beats}/min
Percent HR: 63 %
Rest HR: 50 {beats}/min
Rest Nuclear Isotope Dose: 9.5 mCi
SDS: 0
SRS: 0
SSS: 0
ST Depression (mm): 0 mm
Stress Nuclear Isotope Dose: 29.4 mCi
TID: 1.16

## 2022-04-11 MED ORDER — TECHNETIUM TC 99M TETROFOSMIN IV KIT
10.0000 | PACK | Freq: Once | INTRAVENOUS | Status: AC
  Administered 2022-04-11: 9.53 via INTRAVENOUS

## 2022-04-11 MED ORDER — TECHNETIUM TC 99M TETROFOSMIN IV KIT
29.3600 | PACK | Freq: Once | INTRAVENOUS | Status: AC | PRN
  Administered 2022-04-11: 29.36 via INTRAVENOUS

## 2022-04-11 MED ORDER — REGADENOSON 0.4 MG/5ML IV SOLN
0.4000 mg | Freq: Once | INTRAVENOUS | Status: AC
  Administered 2022-04-11: 0.4 mg via INTRAVENOUS

## 2022-04-11 NOTE — Progress Notes (Signed)
Low risk scan without evidence of ischemia or infarct noted. Continue with current medications and keep follow up appointment.

## 2022-04-20 ENCOUNTER — Other Ambulatory Visit
Admission: RE | Admit: 2022-04-20 | Discharge: 2022-04-20 | Disposition: A | Discharge status: Home / Self Care | Payer: Medicaid Other | Source: Ambulatory Visit | Attending: Medical | Admitting: Medical

## 2022-04-20 ENCOUNTER — Ambulatory Visit: Payer: Self-pay | Attending: Medical | Admitting: Medical

## 2022-04-20 ENCOUNTER — Encounter: Payer: Self-pay | Admitting: Medical

## 2022-04-20 ENCOUNTER — Telehealth: Payer: Self-pay | Admitting: *Deleted

## 2022-04-20 VITALS — BP 108/63 | HR 53 | Ht 65.0 in | Wt 143.2 lb

## 2022-04-20 DIAGNOSIS — E782 Mixed hyperlipidemia: Secondary | ICD-10-CM

## 2022-04-20 DIAGNOSIS — Z87891 Personal history of nicotine dependence: Secondary | ICD-10-CM

## 2022-04-20 DIAGNOSIS — I25112 Atherosclerotic heart disease of native coronary artery with refractory angina pectoris: Secondary | ICD-10-CM

## 2022-04-20 LAB — LIPID PANEL
Cholesterol: 122 mg/dL (ref 0–200)
HDL: 38 mg/dL — ABNORMAL LOW (ref >40)
LDL Cholesterol: 80 mg/dL (ref 0–99)
Total CHOL/HDL Ratio: 3.2 RATIO
Triglycerides: 22 mg/dL (ref <150)
VLDL: 4 mg/dL (ref 0–40)

## 2022-04-20 MED ORDER — RANOLAZINE ER 500 MG PO TB12: 500.0000 mg | ORAL_TABLET | Freq: Two times a day (BID) | ORAL | 3 refills | Status: DC

## 2022-04-20 NOTE — Progress Notes (Signed)
Cardiology Office Note:    Date:  04/20/2022   ID:  Walter Duncan, DOB Apr 28, 1996, MRN [id number]  PCP:  Patient, No Pcp Per  [facility] Cardiologist:  Lorine Bears, MD  [facility] Electrophysiologist:  None   Referring MD: No ref. provider found   Chief Complaint: follow-up Myoview   History of Present Illness:    Walter Duncan is a 26 y.o. male with a hx of h/o tobacco abuse and CAD /p DES mLCX and mLAD who presents for follow-up.   He presented to the [facility] emergency department on 02/06/2022 for chest pain was treated for GERD, however there was no improvement in his symptoms and he started feeling worse.  He continued to have chest discomfort. He was found to significantly have elevated high-sensitivity troponin at 7000, CT of the chest showed no evidence of pulmonary embolism or aortic dissection, incidentally significant coronary calcifications were noted especially in the LAD distribution.  On 02/06/2022 he underwent left heart catheterization which revealed significant two-vessel coronary artery disease.  Coronary arteries were mildly calcified and diffusely diseased considering his young age.  Normal LV systolic function with mildly elevated left ventricular end-diastolic pressure.  He underwent successful angioplasty and drug-eluting stent placement to the mid left circumflex and mid left anterior descending artery.  Echocardiogram revealed LVEF of 55-60%, no regional wall motion abnormalities, and mild mitral valve regurgitation.  Prior to discharge he was recommended for at least 12 months of dual antiplatelet therapy, aggressive treatment of risk factors, smoking cessation, and cardiac rehab.  Last seen 04/05/22 and was doing well with complaints of chest pressure. A Myoview lexiscan was ordered. It was low risk with no evidence of ischemia or infarct.   Today, the Myoview Lexiscan was ordered. The patient reports persistent chest pain. Chest pain is worse when heart rate  goes above 100bpm. Highest heat rate reported at 137bpm. This only occurs when he exerts himself. Its center/right sided pressure. When he has it, he takes a NTG and this helps. He denies missing doses of the ASA and plavix. No LLE, orthopnea or pnd.  He is not doing cardiac rehab. He is going to the gym 3-4 days a week. He walks for 2 miles. No chest or shortness of breath with this. He is eating and drinking nomally.   Past Medical History:  Diagnosis Date   CAD (coronary artery disease)    History of tobacco use     Past Surgical History:  Procedure Laterality Date   CORONARY STENT INTERVENTION N/A 02/06/2022   Procedure: CORONARY STENT INTERVENTION;  Surgeon: Iran Ouch, MD;  Location: [facility] INVASIVE CV LAB;  Service: Cardiovascular;  Laterality: N/A;   LEFT HEART CATH AND CORONARY ANGIOGRAPHY N/A 02/06/2022   Procedure: LEFT HEART CATH AND CORONARY ANGIOGRAPHY;  Surgeon: Iran Ouch, MD;  Location: [facility] INVASIVE CV LAB;  Service: Cardiovascular;  Laterality: N/A;    Current Medications: Current Meds  Medication Sig   aspirin EC 81 MG tablet Take 1 tablet (81 mg total) by mouth daily. Swallow whole.   atorvastatin (LIPITOR) 80 MG tablet Take 1 tablet (80 mg total) by mouth daily.   clopidogrel (PLAVIX) 75 MG tablet Take 1 tablet (75 mg total) by mouth daily.   nitroGLYCERIN (NITROSTAT) 0.4 MG SL tablet Place 1 tablet (0.4 mg total) under the tongue every 5 (five) minutes x 3 doses as needed for chest pain.   ranolazine (RANEXA) 500 MG 12 hr tablet Take 1 tablet (500 mg  total) by mouth 2 (two) times daily.     Allergies:   Patient has no known allergies.   Social History   Socioeconomic History   Marital status: Single    Spouse name: Not on file   Number of children: Not on file   Years of education: Not on file   Highest education level: Not on file  Occupational History   Not on file  Tobacco Use   Smoking status: Former    Packs/day: 0.50    Types: Cigarettes     Quit date: 02/07/2022    Years since quitting: 0.1   Smokeless tobacco: Never   Tobacco comments:    Quit 02/07/22  Vaping Use   Vaping Use: Former  Substance and Sexual Activity   Alcohol use: Not Currently    Comment: last was 01/02/22   Drug use: Never   Sexual activity: Not on file  Other Topics Concern   Not on file  Social History Narrative   Not on file   Social Determinants of Health   Financial Resource Strain: Not on file  Food Insecurity: Not on file  Transportation Needs: Not on file  Physical Activity: Not on file  Stress: Not on file  Social Connections: Not on file     Family History: The patient's family history is not on file.  ROS:   Please see the history of present illness.     All other systems reviewed and are negative.  EKGs/Labs/Other Studies Reviewed:    The following studies were reviewed today:  Myoview Lexsican 04/2022 Perfusion/Defect LV perfusion is normal.  Chamber Size Left ventricular function is normal. Nuclear stress EF: 52 %. End diastolic cavity size is normal.  Stress Combined Conclusion Findings are consistent with no prior ischemia. The study is low risk.    LHC 01/2022   Mid RCA lesion is 30% stenosed.   RPDA lesion is 50% stenosed.   Prox LAD to Mid LAD lesion is 40% stenosed.   Mid LAD to Dist LAD lesion is 40% stenosed.   Ost Cx to Prox Cx lesion is 40% stenosed.   3rd Mrg lesion is 50% stenosed.   Mid Cx to Dist Cx lesion is 90% stenosed.   Mid LAD lesion is 80% stenosed.   A drug-eluting stent was successfully placed using a STENT ONYX FRONTIER 2.25X18.   A drug-eluting stent was successfully placed using a STENT ONYX FRONTIER [id number].   Post intervention, there is a 0% residual stenosis.   Post intervention, there is a 0% residual stenosis.   The left ventricular systolic function is normal.   LV end diastolic pressure is mildly elevated.   The left ventricular ejection fraction is 55-65% by visual estimate.   1.   Significant two-vessel coronary artery disease.  The coronary arteries are surprisingly mildly calcified and diffusely diseased considering relatively young age. 2.  Normal LV systolic function and mildly elevated left ventricular end-diastolic pressure. 3.  Successful angioplasty and drug-eluting stent placement to the mid left circumflex and mid left anterior descending artery.   Recommendations: Dual antiplatelet therapy for at least 12 months. Aggressive treatment of risk factors. Smoking cessation and cardiac rehab.  Coronary Diagrams  Diagnostic Dominance: Right  Intervention    Echo 01/2022  1. Left ventricular ejection fraction, by estimation, is 55 to 60%. The  left ventricle has normal function. The left ventricle has no regional  wall motion abnormalities. Left ventricular diastolic parameters were  normal.   2. Right  ventricular systolic function is normal. The right ventricular  size is normal. Tricuspid regurgitation signal is inadequate for assessing  PA pressure.   3. The mitral valve is normal in structure. Mild mitral valve  regurgitation. No evidence of mitral stenosis.   4. The aortic valve is normal in structure. Aortic valve regurgitation is  not visualized. No aortic stenosis is present.   5. The inferior vena cava is normal in size with greater than 50%  respiratory variability, suggesting right atrial pressure of 3 mmHg.   EKG:  EKG is not ordered today.    Recent Labs: 02/01/2022: ALT 39 02/07/2022: BUN 12; Creatinine, Ser 0.85; Hemoglobin 13.8; Platelets 299; Potassium 4.4; Sodium 139  Recent Lipid Panel    Component Value Date/Time   CHOL 279 (H) 02/07/2022 0432   TRIG 297 (H) 02/07/2022 0432   HDL 27 (L) 02/07/2022 0432   CHOLHDL 10.3 02/07/2022 0432   VLDL 59 (H) 02/07/2022 0432   LDLCALC 193 (H) 02/07/2022 0432    Physical Exam:    VS:  BP 108/63 (BP Location: Left Arm, Patient Position: Sitting, Cuff Size: Normal)   Pulse (!) 53   Ht 5'  5" (1.651 m)   Wt 143 lb 3.2 oz (65 kg)   SpO2 98%   BMI 23.83 kg/m     Wt Readings from Last 3 Encounters:  04/20/22 143 lb 3.2 oz (65 kg)  04/05/22 140 lb 2 oz (63.6 kg)  02/21/22 137 lb 12.8 oz (62.5 kg)     GEN:  Well nourished, well developed in no acute distress HEENT: Normal NECK: No JVD; No carotid bruits LYMPHATICS: No lymphadenopathy CARDIAC: RRR, no murmurs, rubs, gallops RESPIRATORY:  Clear to auscultation without rales, wheezing or rhonchi  ABDOMEN: Soft, non-tender, non-distended MUSCULOSKELETAL:  No edema; No deformity  SKIN: Warm and dry NEUROLOGIC:  Alert and oriented x 3 PSYCHIATRIC:  Normal affect   ASSESSMENT:    1. 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 Mixed hyperlipidemia   3. History of tobacco use    PLAN:    In order of problems listed above:  Chest pain CAD s/p DESLcx and mLAD 01/2022 Recent Myoview showed no new ischemia and was overall low risk. He reports persistent chest pain on exertion when heart rate goes above 100bpm. He is not doing cardiac rehab, but walking at the gym 2 miles and denies anginal symptoms with this. He denies missing doses of DAPT. Soft BP And low heart rate preclude the use of many antianginal medications. I will start Ranexa 500mg  BID. We will see him back in a month and may need to consider repeat heart cath if pain is persistent. Continue Aspirin, Plavix, and Lipitor.   HLD LDL 193, TG 297, HDL 27, total chol 279. Continue Lipitor 80mg  daily. We will re-check fasting lipid panel.   Tobacco use He quit smoking 2 months ago  Disposition: Follow up in 1 month(s) with MD/APP     Signed, Haivyn Oravec Ninfa Meeker, PA-C  04/20/2022 8:55 AM    [facility]

## 2022-04-20 NOTE — Patient Instructions (Signed)
Medication Instructions:  Your physician has recommended you make the following change in your medication:  -Start Ranexa 500 mg tables twice daily   Labwork: Today:  -Fasting Lipid Panel   Testing/Procedures: None  Follow-Up: Follow up with Cadence Furth in 1 month.   Any Other Special Instructions Will Be Listed Below (If Applicable).     If you need a refill on your cardiac medications before your next appointment, please call your pharmacy.

## 2022-04-20 NOTE — Telephone Encounter (Signed)
refill 

## 2022-04-23 ENCOUNTER — Other Ambulatory Visit: Payer: Self-pay

## 2022-04-23 MED ORDER — EZETIMIBE 10 MG PO TABS: 10.0000 mg | ORAL_TABLET | Freq: Every day | ORAL | 3 refills | Status: DC

## 2022-05-17 NOTE — Progress Notes (Signed)
Cardiology Office Note:    Date:  05/18/2022   ID:  Walter Duncan, DOB [birth year], MRN [id number]  PCP:  Patient, No Pcp Per  [facility] Cardiologist:  Lorine Bears, MD  [facility] Electrophysiologist:  None   Referring MD: No ref. provider found   Chief Complaint: 1 month follow-up  History of Present Illness:    Walter Duncan is a 26 y.o. male with a hx of tobacco abuse, hyperlipidemia and CAD /p DES mLCX and mLAD who presents for follow-up.    He presented to the [facility] emergency department on 02/06/2022 for chest pain was treated for GERD, however there was no improvement in his symptoms and he started feeling worse.  He continued to have chest discomfort. He was found to significantly have elevated high-sensitivity troponin at 7000, CT of the chest showed no evidence of pulmonary embolism or aortic dissection, incidentally significant coronary calcifications were noted especially in the LAD distribution.  On 02/06/2022 he underwent left heart catheterization which revealed significant two-vessel coronary artery disease.  Coronary arteries were mildly calcified and diffusely diseased considering his young age.  Normal LV systolic function with mildly elevated left ventricular end-diastolic pressure.  He underwent successful angioplasty and drug-eluting stent placement to the mid left circumflex and mid left anterior descending artery.  Echocardiogram revealed LVEF of 55-60%, no regional wall motion abnormalities, and mild mitral valve regurgitation.  Prior to discharge he was recommended for at least 12 months of dual antiplatelet therapy, aggressive treatment of risk factors, smoking cessation, and cardiac rehab.   She was seen 04/05/22 and was doing well with complaints of chest pressure. A Myoview lexiscan was ordered. It was low risk with no evidence of ischemia or infarct.   Last seen 04/20/2022 reported persistent chest pain with elevated heart rate, however he was walking 2  miles at the gym.  Patient was started on Ranexa 500 mg twice daily.  Today,the patient reports two episdoes of chest pain.  First episode of chest pain was 3 nights ago while at rest.  It was resolved with 7 1 nitroglycerin.  The next day he had chest pain at work, he took 2 sublingual nitroglycerin with resolution of pain.  Pain is left-sided and pressure-like.  Patient reports overall improvement of symptoms on Ranexa.  Vitals are stable today.  He denies active chest pain.  He reports compliance with aspirin and Plavix.  Past Medical History:  Diagnosis Date   CAD (coronary artery disease)    History of tobacco use     Past Surgical History:  Procedure Laterality Date   CORONARY STENT INTERVENTION N/A 02/06/2022   Procedure: CORONARY STENT INTERVENTION;  Surgeon: Iran Ouch, MD;  Location: [facility] INVASIVE CV LAB;  Service: Cardiovascular;  Laterality: N/A;   LEFT HEART CATH AND CORONARY ANGIOGRAPHY N/A 02/06/2022   Procedure: LEFT HEART CATH AND CORONARY ANGIOGRAPHY;  Surgeon: Iran Ouch, MD;  Location: [facility] INVASIVE CV LAB;  Service: Cardiovascular;  Laterality: N/A;    Current Medications: Current Meds  Medication Sig   aspirin EC 81 MG tablet Take 1 tablet (81 mg total) by mouth daily. Swallow whole.   atorvastatin (LIPITOR) 80 MG tablet Take 1 tablet (80 mg total) by mouth daily.   clopidogrel (PLAVIX) 75 MG tablet Take 1 tablet (75 mg total) by mouth daily.   ezetimibe (ZETIA) 10 MG tablet Take 1 tablet (10 mg total) by mouth daily.   metoprolol succinate (TOPROL XL) 25 MG 24 hr tablet Take  0.5 tablets (12.5 mg total) by mouth daily.   nitroGLYCERIN (NITROSTAT) 0.4 MG SL tablet Place 1 tablet (0.4 mg total) under the tongue every 5 (five) minutes x 3 doses as needed for chest pain.   ranolazine (RANEXA) 500 MG 12 hr tablet Take 1 tablet (500 mg total) by mouth 2 (two) times daily.     Allergies:   Patient has no known allergies.   Social History   Socioeconomic  History   Marital status: Single    Spouse name: Not on file   Number of children: Not on file   Years of education: Not on file   Highest education level: Not on file  Occupational History   Not on file  Tobacco Use   Smoking status: Former    Packs/day: 0.50    Types: Cigarettes    Quit date: 02/07/2022    Years since quitting: 0.2   Smokeless tobacco: Never   Tobacco comments:    Quit 02/07/22  Vaping Use   Vaping Use: Former  Substance and Sexual Activity   Alcohol use: Not Currently    Comment: last was 01/02/22   Drug use: Never   Sexual activity: Not on file  Other Topics Concern   Not on file  Social History Narrative   Not on file   Social Determinants of Health   Financial Resource Strain: Not on file  Food Insecurity: Not on file  Transportation Needs: Not on file  Physical Activity: Not on file  Stress: Not on file  Social Connections: Not on file     Family History: The patient's family history is not on file.  ROS:   Please see the history of present illness.     All other systems reviewed and are negative.  EKGs/Labs/Other Studies Reviewed:    The following studies were reviewed today:  Echo 01/2022 1. Left ventricular ejection fraction, by estimation, is 55 to 60%. The  left ventricle has normal function. The left ventricle has no regional  wall motion abnormalities. Left ventricular diastolic parameters were  normal.   2. Right ventricular systolic function is normal. The right ventricular  size is normal. Tricuspid regurgitation signal is inadequate for assessing  PA pressure.   3. The mitral valve is normal in structure. Mild mitral valve  regurgitation. No evidence of mitral stenosis.   4. The aortic valve is normal in structure. Aortic valve regurgitation is  not visualized. No aortic stenosis is present.   5. The inferior vena cava is normal in size with greater than 50%  respiratory variability, suggesting right atrial pressure of 3 mmHg.    LHC 01/2022    Mid RCA lesion is 30% stenosed.   RPDA lesion is 50% stenosed.   Prox LAD to Mid LAD lesion is 40% stenosed.   Mid LAD to Dist LAD lesion is 40% stenosed.   Ost Cx to Prox Cx lesion is 40% stenosed.   3rd Mrg lesion is 50% stenosed.   Mid Cx to Dist Cx lesion is 90% stenosed.   Mid LAD lesion is 80% stenosed.   A drug-eluting stent was successfully placed using a STENT ONYX FRONTIER 2.25X18.   A drug-eluting stent was successfully placed using a STENT ONYX FRONTIER [id number].   Post intervention, there is a 0% residual stenosis.   Post intervention, there is a 0% residual stenosis.   The left ventricular systolic function is normal.   LV end diastolic pressure is mildly elevated.   The left ventricular  ejection fraction is 55-65% by visual estimate.   1.  Significant two-vessel coronary artery disease.  The coronary arteries are surprisingly mildly calcified and diffusely diseased considering relatively young age. 2.  Normal LV systolic function and mildly elevated left ventricular end-diastolic pressure. 3.  Successful angioplasty and drug-eluting stent placement to the mid left circumflex and mid left anterior descending artery.   Recommendations: Dual antiplatelet therapy for at least 12 months. Aggressive treatment of risk factors. Smoking cessation and cardiac rehab.  Coronary Diagrams  Diagnostic Dominance: Right  Intervention      EKG:  EKG is not ordered today.    Recent Labs: 02/01/2022: ALT 39 02/07/2022: BUN 12; Creatinine, Ser 0.85; Hemoglobin 13.8; Platelets 299; Potassium 4.4; Sodium 139  Recent Lipid Panel    Component Value Date/Time   CHOL 122 04/20/2022 0910   TRIG 22 04/20/2022 0910   HDL 38 (L) 04/20/2022 0910   CHOLHDL 3.2 04/20/2022 0910   VLDL 4 04/20/2022 0910   LDLCALC 80 04/20/2022 0910     Physical Exam:    VS:  BP 110/80 (BP Location: Left Arm, Patient Position: Sitting, Cuff Size: Normal)   Pulse 73   Ht 5\' 5"  (1.651 m)    Wt 143 lb 3.2 oz (65 kg)   SpO2 98%   BMI 23.83 kg/m     Wt Readings from Last 3 Encounters:  05/18/22 143 lb 3.2 oz (65 kg)  04/20/22 143 lb 3.2 oz (65 kg)  04/05/22 140 lb 2 oz (63.6 kg)     GEN: Well nourished, well developed in no acute distress HEENT: Normal NECK: No JVD; No carotid bruits LYMPHATICS: No lymphadenopathy CARDIAC: RRR, no murmurs, rubs, gallops RESPIRATORY:  Clear to auscultation without rales, wheezing or rhonchi  ABDOMEN: Soft, non-tender, non-distended MUSCULOSKELETAL:  No edema; No deformity  SKIN: Warm and dry NEUROLOGIC:  Alert and oriented x 3 PSYCHIATRIC:  Normal affect   ASSESSMENT:    1. Coronary artery disease involving native coronary artery of native heart with angina pectoris ([facility])   2. Hyperlipidemia, mixed    PLAN:    In order of problems listed above:  Chest pain CAD s/p DES Lcx and mLAD 01/2022 Patient reports 2 episodes of left-sided chest pain, 1 at rest and 1 on exertion.  Both resolved with nitroglycerin.  Patient was started on Ranexa at the last visit, and overall feels improvement in his symptoms.  Patient is chest pain-free today.  Patient reports compliance with aspirin and Plavix.  Patient had a Myoview stress test October 2023 that was low risk with no evidence of ischemia, LV perfusion was normal, EF 43%.  We discussed medication management versus cardiac cath.  Patient would like to defer cardiac cath as long as possible.  I will start Toprol 12.5 mg daily.  We will see him back in 2 to 3 months to reevaluate symptoms.  Continue Lipitor 80 mg daily, aspirin 81 mg daily, Plavix 75 mg, Zetia, sublingual nitro, Ranexa 500 mg twice daily.  Hyperlipidemia Follow-up lipid panel showed total cholesterol 122, triglycerides 22, HDL 38, LDL 80.  Patient is taking Lipitor 80 mg and Zetia 10 mg daily.  I recommended lifestyle changes with diet and exercise.  Disposition: Follow up in 3 month(s) with APP    Signed, Grahm Etsitty November 2023,  PA-C  05/18/2022 9:16 AM    [facility]

## 2022-05-18 ENCOUNTER — Ambulatory Visit: Payer: Self-pay | Attending: Medical | Admitting: Medical

## 2022-05-18 ENCOUNTER — Other Ambulatory Visit: Payer: Self-pay

## 2022-05-18 ENCOUNTER — Encounter: Payer: Self-pay | Admitting: Medical

## 2022-05-18 VITALS — BP 110/80 | HR 73 | Ht 65.0 in | Wt 143.2 lb

## 2022-05-18 DIAGNOSIS — I25119 Atherosclerotic heart disease of native coronary artery with unspecified angina pectoris: Secondary | ICD-10-CM

## 2022-05-18 DIAGNOSIS — E782 Mixed hyperlipidemia: Secondary | ICD-10-CM

## 2022-05-18 MED ORDER — METOPROLOL SUCCINATE ER 25 MG PO TB24
12.5000 mg | ORAL_TABLET | Freq: Every day | ORAL | 1 refills | Status: DC
  Filled 2022-05-18: qty 45, 90d supply, fill #0

## 2022-05-18 NOTE — Patient Instructions (Signed)
Medication Instructions:   START Metoprolol Succinate 25 mg---take 1/2 tablet (12.5 mg) once daily.  *If you need a refill on your cardiac medications before your next appointment, please call your pharmacy*  Follow-Up: At [facility], you and your health needs are our priority.  As part of our continuing mission to provide you with exceptional heart care, we have created designated Provider Care Teams.  These Care Teams include your primary Cardiologist (physician) and Advanced Practice Providers (APPs -  Physician Assistants and Nurse Practitioners) who all work together to provide you with the care you need, when you need it.  We recommend signing up for the patient portal called "MyChart".  Sign up information is provided on this After Visit Summary.  MyChart is used to connect with patients for Virtual Visits (Telemedicine).  Patients are able to view lab/test results, encounter notes, upcoming appointments, etc.  Non-urgent messages can be sent to your provider as well.   To learn more about what you can do with MyChart, go to [email or web address].    Your next appointment:   2-3 month(s)  The format for your next appointment:   In Person  Provider:   Cadence Fransico Michael, [place]     Important Information About Sugar

## 2022-06-04 ENCOUNTER — Other Ambulatory Visit: Payer: Self-pay

## 2022-07-23 ENCOUNTER — Other Ambulatory Visit: Payer: Self-pay | Admitting: *Deleted

## 2022-07-23 ENCOUNTER — Encounter: Payer: Self-pay | Admitting: Medical

## 2022-07-23 ENCOUNTER — Ambulatory Visit: Payer: Self-pay | Attending: Medical | Admitting: Medical

## 2022-07-23 VITALS — BP 106/82 | HR 76 | Ht 65.0 in | Wt 148.2 lb

## 2022-07-23 DIAGNOSIS — I214 Non-ST elevation (NSTEMI) myocardial infarction: Secondary | ICD-10-CM

## 2022-07-23 DIAGNOSIS — Z720 Tobacco use: Secondary | ICD-10-CM

## 2022-07-23 DIAGNOSIS — E782 Mixed hyperlipidemia: Secondary | ICD-10-CM

## 2022-07-23 DIAGNOSIS — I25119 Atherosclerotic heart disease of native coronary artery with unspecified angina pectoris: Secondary | ICD-10-CM

## 2022-07-23 MED ORDER — ISOSORBIDE MONONITRATE ER 30 MG PO TB24: ORAL_TABLET | ORAL | 1 refills | Status: DC

## 2022-07-23 NOTE — Patient Instructions (Signed)
Medication Instructions:  - Your physician has recommended you make the following change in your medication:   1) START Imdur (Isosorbide MN) 30 mg: - take 0.5 tablet (15 mg) by mouth once daily   *If you need a refill on your cardiac medications before your next appointment, please call your pharmacy*   Lab Work: - none ordered  If you have labs (blood work) drawn today and your tests are completely normal, you will receive your results only by: [place] (if you have MyChart) OR A paper copy in the mail If you have any lab test that is abnormal or we need to change your treatment, we will call you to review the results.   Testing/Procedures: 1) You have been referred to cardiac rehab. This is a combination program including monitored exercise, dietary education, and support group. We strongly recommend participating in the program. Expect a phone call from them in approximately 2 weeks. If you have not heard from the Cardiac Rehab department within 2 weeks, please call them directly at [phone]     Follow-Up: At [facility], you and your health needs are our priority.  As part of our continuing mission to provide you with exceptional heart care, we have created designated Provider Care Teams.  These Care Teams include your primary Cardiologist (physician) and Advanced Practice Providers (APPs -  Physician Assistants and Nurse Practitioners) who all work together to provide you with the care you need, when you need it.  We recommend signing up for the patient portal called "MyChart".  Sign up information is provided on this After Visit Summary.  MyChart is used to connect with patients for Virtual Visits (Telemedicine).  Patients are able to view lab/test results, encounter notes, upcoming appointments, etc.  Non-urgent messages can be sent to your provider as well.   To learn more about what you can do with MyChart, go to [email or web address].    Your next  appointment:   1-2 month(s)  Provider:   You may see Kathlyn Sacramento, MD or one of the following Advanced Practice Providers on your designated Care Team:    Cadence Kathlen Mody, [place]   Other Instructions Isosorbide Mononitrate Extended-Release Tablets What is this medication? ISOSORBIDE MONONITRATE (eye soe SOR bide mon oh NYE trate) prevents chest pain (angina). It works by relaxing blood vessels, which decreases the amount of work the heart has to do. It belongs to a group of medications called nitrates. Do not use it to treat sudden chest pain. This medicine may be used for other purposes; ask your health care provider or pharmacist if you have questions. COMMON BRAND NAME(S): Imdur, Isotrate ER What should I tell my care team before I take this medication? They need to know if you have any of these conditions: Previous heart attack or heart failure An unusual or allergic reaction to isosorbide mononitrate, nitrates, other medications, foods, dyes, or preservatives Pregnant or trying to get pregnant Breast-feeding How should I use this medication? Take this medication by mouth with a glass of water. Follow the directions on the prescription label. Do not crush or chew. Take your medication at regular intervals. Do not take your medication more often than directed. Do not stop taking this medication except on the advice of your care team. Talk to your care team about the use of this medication in children. Special care may be needed. Overdosage: If you think you have taken too much of this medicine contact a poison control center or emergency  room at once. NOTE: This medicine is only for you. Do not share this medicine with others. What if I miss a dose? If you miss a dose, take it as soon as you can. If it is almost time for your next dose, take only that dose. Do not take double or extra doses. What may interact with this medication? Do not take this medication with any of the  following: Certain medications for erectile dysfunction (ED), such as avanafil, sildenafil, tadalafil, or vardenafil Riociguat This medication may also interact with the following: Medications for high blood pressure Other medications for angina or heart failure This list may not describe all possible interactions. Give your health care provider a list of all the medicines, herbs, non-prescription drugs, or dietary supplements you use. Also tell them if you smoke, drink alcohol, or use illegal drugs. Some items may interact with your medicine. What should I watch for while using this medication? Visit your care team for regular checks on your progress. Check your heart rate and blood pressure as directed. Know what your heart rate and blood pressure should be and when to contact your care team. Tell your care team if you feel your medication is no longer working. This medication may affect your coordination, reaction time, or judgment. Do not drive or operate machinery until you know how this medication affects you. Sit up or stand slowly to reduce the risk of dizzy or fainting spells. Drinking alcohol with this medication can increase the risk of these side effects Do not treat yourself for coughs, colds, or pain while you are taking this medication without asking your care team for advice. Some medications may increase your blood pressure. What side effects may I notice from receiving this medication? Side effects that you should report to your care team as soon as possible: Allergic reactions--skin rash, itching, hives, swelling of the face, lips, tongue, or throat Headache, unusual weakness or fatigue, shortness of breath, nausea, vomiting, rapid heartbeat, blue skin or lips, which may be signs of methemoglobinemia Increased pressure around the brain--severe headache, blurry vision, change in vision, nausea, vomiting Low blood pressure--dizziness, feeling faint or lightheaded, blurry vision Slow  heartbeat--dizziness, feeling faint or lightheaded, confusion, trouble breathing, unusual weakness or fatigue Worsening chest pain (angina)--pain, pressure, or tightness in the chest, neck, back, or arms Side effects that usually do not require medical attention (report to your care team if they continue or are bothersome): Dizziness Flushing Headache This list may not describe all possible side effects. Call your doctor for medical advice about side effects. You may report side effects to FDA at 1-800-FDA-1088. Where should I keep my medication? Keep out of the reach of children. Store between 15 and 30 degrees C (59 and 86 degrees F). Keep container tightly closed. Throw away any unused medication after the expiration date. NOTE: This sheet is a summary. It may not cover all possible information. If you have questions about this medicine, talk to your doctor, pharmacist, or health care provider.  2023 Elsevier/Gold Standard (2020-10-03 00:00:00)

## 2022-07-23 NOTE — Progress Notes (Signed)
Cardiology Office Note:    Date:  07/23/2022   ID:  Walter Duncan, DOB Oct 31, 1995, MRN [id number]  PCP:  Patient, No Pcp Per  [facility] Cardiologist:  Lorine Bears, MD  [facility] Electrophysiologist:  None   Referring MD: No ref. provider found   Chief Complaint: 2-3 month follow-up  History of Present Illness:    Walter Duncan is a 27 y.o. male with a hx of tobacco abuse, hyperlipidemia and CAD /p DES mLCX and mLAD who presents for follow-up.    He presented to the [facility] emergency department on 02/06/2022 for chest pain was treated for GERD, however there was no improvement in his symptoms and he started feeling worse.  He continued to have chest discomfort. He was found to significantly have elevated high-sensitivity troponin at 7000, CT of the chest showed no evidence of pulmonary embolism or aortic dissection, incidentally significant coronary calcifications were noted especially in the LAD distribution.  On 02/06/2022 he underwent left heart catheterization which revealed significant two-vessel coronary artery disease.  Coronary arteries were mildly calcified and diffusely diseased considering his young age.  Normal LV systolic function with mildly elevated left ventricular end-diastolic pressure.  He underwent successful angioplasty and drug-eluting stent placement to the mid left circumflex and mid left anterior descending artery.  Echocardiogram revealed LVEF of 55-60%, no regional wall motion abnormalities, and mild mitral valve regurgitation.  Prior to discharge he was recommended for at least 12 months of dual antiplatelet therapy, aggressive treatment of risk factors, smoking cessation, and cardiac rehab.   She was seen 04/05/22 and was doing well with complaints of chest pressure. A Myoview lexiscan was ordered. It was low risk with no evidence of ischemia or infarct.    The patient was seen on 04/20/2022 and reported persistent chest pain with elevated heart rate, however  he was walking 2 miles at the gym.  Patient was started on Ranexa 500 mg twice daily.  Last seen 05/18/22 and reported improved but persistent chest pain. He was started on Toprol.   Today, the patient reports some trouble at his job. He puts pip in the ground. They feel he is unable to perform his job and feels he is being discriminated against. He is able to do his job, but he is just slower. Still having minimal chest pain. When he is working he gets a lot of SOB. Never started Toprol. Has taken SL NTG, needs refill.    Past Medical History:  Diagnosis Date   CAD (coronary artery disease)    History of tobacco use     Past Surgical History:  Procedure Laterality Date   CORONARY STENT INTERVENTION N/A 02/06/2022   Procedure: CORONARY STENT INTERVENTION;  Surgeon: Iran Ouch, MD;  Location: [facility] INVASIVE CV LAB;  Service: Cardiovascular;  Laterality: N/A;   LEFT HEART CATH AND CORONARY ANGIOGRAPHY N/A 02/06/2022   Procedure: LEFT HEART CATH AND CORONARY ANGIOGRAPHY;  Surgeon: Iran Ouch, MD;  Location: [facility] INVASIVE CV LAB;  Service: Cardiovascular;  Laterality: N/A;    Current Medications: Current Meds  Medication Sig   aspirin EC 81 MG tablet Take 1 tablet (81 mg total) by mouth daily. Swallow whole.   atorvastatin (LIPITOR) 80 MG tablet Take 1 tablet (80 mg total) by mouth daily.   clopidogrel (PLAVIX) 75 MG tablet Take 1 tablet (75 mg total) by mouth daily.   ezetimibe (ZETIA) 10 MG tablet Take 1 tablet (10 mg total) by mouth daily.   isosorbide  mononitrate (IMDUR) 30 MG 24 hr tablet Take 0.5 tablet (15 mg) by mouth once daily   nitroGLYCERIN (NITROSTAT) 0.4 MG SL tablet Place 1 tablet (0.4 mg total) under the tongue every 5 (five) minutes x 3 doses as needed for chest pain.   ranolazine (RANEXA) 500 MG 12 hr tablet Take 1 tablet (500 mg total) by mouth 2 (two) times daily.     Allergies:   Patient has no known allergies.   Social History   Socioeconomic History    Marital status: Single    Spouse name: Not on file   Number of children: Not on file   Years of education: Not on file   Highest education level: Not on file  Occupational History   Not on file  Tobacco Use   Smoking status: Former    Packs/day: 0.50    Types: Cigarettes    Quit date: 02/07/2022    Years since quitting: 0.4   Smokeless tobacco: Never   Tobacco comments:    Quit 02/07/22  Vaping Use   Vaping Use: Former  Substance and Sexual Activity   Alcohol use: Not Currently    Comment: last was 01/02/22   Drug use: Never   Sexual activity: Not on file  Other Topics Concern   Not on file  Social History Narrative   Not on file   Social Determinants of Health   Financial Resource Strain: Not on file  Food Insecurity: Not on file  Transportation Needs: Not on file  Physical Activity: Not on file  Stress: Not on file  Social Connections: Not on file     Family History: The patient's family history includes Arthritis/Rheumatoid in his father; Heart attack (age of onset: 76) in his mother; Hyperlipidemia in his father; Renal Disease in his mother.  ROS:   Please see the history of present illness.     All other systems reviewed and are negative.  EKGs/Labs/Other Studies Reviewed:    The following studies were reviewed today:  Echo 01/2022 1. Left ventricular ejection fraction, by estimation, is 55 to 60%. The  left ventricle has normal function. The left ventricle has no regional  wall motion abnormalities. Left ventricular diastolic parameters were  normal.   2. Right ventricular systolic function is normal. The right ventricular  size is normal. Tricuspid regurgitation signal is inadequate for assessing  PA pressure.   3. The mitral valve is normal in structure. Mild mitral valve  regurgitation. No evidence of mitral stenosis.   4. The aortic valve is normal in structure. Aortic valve regurgitation is  not visualized. No aortic stenosis is present.   5. The  inferior vena cava is normal in size with greater than 50%  respiratory variability, suggesting right atrial pressure of 3 mmHg.    LHC 01/2022    Mid RCA lesion is 30% stenosed.   RPDA lesion is 50% stenosed.   Prox LAD to Mid LAD lesion is 40% stenosed.   Mid LAD to Dist LAD lesion is 40% stenosed.   Ost Cx to Prox Cx lesion is 40% stenosed.   3rd Mrg lesion is 50% stenosed.   Mid Cx to Dist Cx lesion is 90% stenosed.   Mid LAD lesion is 80% stenosed.   A drug-eluting stent was successfully placed using a STENT ONYX FRONTIER [id number].   A drug-eluting stent was successfully placed using a STENT ONYX FRONTIER [id number].   Post intervention, there is a 0% residual stenosis.   Post intervention, there  is a 0% residual stenosis.   The left ventricular systolic function is normal.   LV end diastolic pressure is mildly elevated.   The left ventricular ejection fraction is 55-65% by visual estimate.   1.  Significant two-vessel coronary artery disease.  The coronary arteries are surprisingly mildly calcified and diffusely diseased considering relatively young age. 2.  Normal LV systolic function and mildly elevated left ventricular end-diastolic pressure. 3.  Successful angioplasty and drug-eluting stent placement to the mid left circumflex and mid left anterior descending artery.   Recommendations: Dual antiplatelet therapy for at least 12 months. Aggressive treatment of risk factors. Smoking cessation and cardiac rehab.  Coronary Diagrams   Diagnostic Dominance: Right  Intervention       EKG:  EKG is ordered today.  The ekg ordered today demonstrates NSR, 76bpm, TWI III  Recent Labs: 02/01/2022: ALT 39 02/07/2022: BUN 12; Creatinine, Ser 0.85; Hemoglobin 13.8; Platelets 299; Potassium 4.4; Sodium 139  Recent Lipid Panel    Component Value Date/Time   CHOL 122 04/20/2022 0910   TRIG 22 04/20/2022 0910   HDL 38 (L) 04/20/2022 0910   CHOLHDL 3.2 04/20/2022 0910   VLDL 4  04/20/2022 0910   LDLCALC 80 04/20/2022 0910    Physical Exam:    VS:  BP 106/82 (BP Location: Left Arm, Patient Position: Sitting, Cuff Size: Normal)   Pulse 76   Ht 5\' 5"  (1.651 m)   Wt 148 lb 4 oz (67.2 kg)   SpO2 98%   BMI 24.67 kg/m     Wt Readings from Last 3 Encounters:  07/23/22 148 lb 4 oz (67.2 kg)  05/18/22 143 lb 3.2 oz (65 kg)  04/20/22 143 lb 3.2 oz (65 kg)     GEN:  Well nourished, well developed in no acute distress HEENT: Normal NECK: No JVD; No carotid bruits LYMPHATICS: No lymphadenopathy CARDIAC: RRR, no murmurs, rubs, gallops RESPIRATORY:  Clear to auscultation without rales, wheezing or rhonchi  ABDOMEN: Soft, non-tender, non-distended MUSCULOSKELETAL:  No edema; No deformity  SKIN: Warm and dry NEUROLOGIC:  Alert and oriented x 3 PSYCHIATRIC:  Normal affect   ASSESSMENT:    1. Coronary artery disease involving native coronary artery of native heart with angina pectoris (HCC)   2. Hyperlipidemia, mixed   3. Tobacco abuse    PLAN:    In order of problems listed above:  CAD s/p DES Lcx and mLAD 01/2022 Patient reports issues at work, they claim he is unable to complete his work, which may result in termination.Patient's job is very physical, he does have to take a lot of breaks due to dyspnea on exertion. Patient reports persistent occasional chest pain requiring SL NTG. Patient reports compliance with DAPT, aspirin and Plavix.  Patient is taking Ranexa 500 mg twice daily.  Patient had previously been started on Toprol, however this was never started.  Patient also needs a refill sublingual nitroglycerin, we will call this in.  Patient was referred to cardiac rehab, however he was unable to complete due to lack of insurance.  He reports he now has insurance, we will send another referral to cardiac rehab.  I am willing to give patient light duty for 2 months.  We will try Imdur 15 mg daily, he will monitor blood pressure at home. If patient continues to  have symptoms may need to consider relook cardiac cath.  Continue aspirin, Lipitor, Plavix, Zetia, sublingual nitroglycerin, Ranexa.  HLD Most recent LDL was 80.  Continue Lipitor and  Zetia.  Tobacco use Patient is no longer smoking.  Disposition: Follow up in 1-2 month(s) with MD/APP    Signed, Bart Ashford Ninfa Meeker, PA-C  07/23/2022 10:16 AM    [facility]

## 2022-07-25 ENCOUNTER — Telehealth: Payer: Self-pay | Admitting: Medical

## 2022-07-25 NOTE — Telephone Encounter (Signed)
Patient came in with a work letter signed by Cadence on 07/23/22, noted as light duty restriction for 40months. 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 Letter is in box to be updated.

## 2022-07-27 NOTE — Addendum Note (Signed)
Addended by: Michel Santee on: 07/27/2022 11:32 AM   Modules accepted: Orders

## 2022-07-31 NOTE — Telephone Encounter (Signed)
Pt stated light duty consist of how much he can lift and or how many hours he can work in a week.

## 2022-08-01 NOTE — Telephone Encounter (Signed)
Pt stated the following information is ok to include in letter.   Furth, Cadence H, PA-C  You1 hour ago (10:35 AM)    So is working 40 hours a week OK and can't lift anything over 40lbs?    Pt requesting letter be faxed to the following information  [employer]: Donnetta Simpers [phone]

## 2022-08-02 NOTE — Telephone Encounter (Signed)
Letter faxed to Graylon Gunning at [facility] Pt made aware

## 2022-09-10 ENCOUNTER — Encounter: Payer: Self-pay | Admitting: Medical

## 2022-09-10 ENCOUNTER — Ambulatory Visit: Payer: Self-pay | Attending: Medical | Admitting: Medical

## 2022-09-10 VITALS — BP 120/80 | HR 74 | Ht 65.0 in | Wt 145.6 lb

## 2022-09-10 DIAGNOSIS — E782 Mixed hyperlipidemia: Secondary | ICD-10-CM

## 2022-09-10 DIAGNOSIS — I25118 Atherosclerotic heart disease of native coronary artery with other forms of angina pectoris: Secondary | ICD-10-CM

## 2022-09-10 DIAGNOSIS — Z720 Tobacco use: Secondary | ICD-10-CM

## 2022-09-10 MED ORDER — NITROGLYCERIN 0.4 MG SL SUBL: 0.4000 mg | SUBLINGUAL_TABLET | SUBLINGUAL | 0 refills | Status: DC | PRN

## 2022-09-10 NOTE — Patient Instructions (Signed)
Medication Instructions:   Your physician recommends that you continue on your current medications as directed. Please refer to the Current Medication list given to you today.  *If you need a refill on your cardiac medications before your next appointment, please call your pharmacy*   Lab Work:  NONE  If you have labs (blood work) drawn today and your tests are completely normal, you will receive your results only by: [place] (if you have MyChart) OR A paper copy in the mail If you have any lab test that is abnormal or we need to change your treatment, we will call you to review the results.   Testing/Procedures:  NONE   Follow-Up: At [facility], you and your health needs are our priority.  As part of our continuing mission to provide you with exceptional heart care, we have created designated Provider Care Teams.  These Care Teams include your primary Cardiologist (physician) and Advanced Practice Providers (APPs -  Physician Assistants and Nurse Practitioners) who all work together to provide you with the care you need, when you need it.  We recommend signing up for the patient portal called "MyChart".  Sign up information is provided on this After Visit Summary.  MyChart is used to connect with patients for Virtual Visits (Telemedicine).  Patients are able to view lab/test results, encounter notes, upcoming appointments, etc.  Non-urgent messages can be sent to your provider as well.   To learn more about what you can do with MyChart, go to [email or web address].    Your next appointment:   4 month(s)  Provider:   Kathlyn Sacramento, MD

## 2022-09-10 NOTE — Progress Notes (Signed)
Cardiology Office Note:    Date:  09/10/2022   ID:  Walter Duncan, DOB [birth year], MRN [id number]  PCP:  Patient, No Pcp Per  [place] Cardiologist:  Kathlyn Sacramento, MD  [place] Electrophysiologist:  None   Referring MD: No ref. provider found   Chief Complaint: 2 month follow-up  History of Present Illness:    Walter Duncan is a 27 y.o. male with a hx of  tobacco abuse, hyperlipidemia and CAD /p DES mLCX and mLAD who presents for follow-up.    He presented to the [facility] emergency department on 02/06/2022 for chest pain was treated for GERD, however there was no improvement in his symptoms and he started feeling worse.  He continued to have chest discomfort. He was found to significantly have elevated high-sensitivity troponin at 7000, CT of the chest showed no evidence of pulmonary embolism or aortic dissection, incidentally significant coronary calcifications were noted especially in the LAD distribution.  On 02/06/2022 he underwent left heart catheterization which revealed significant two-vessel coronary artery disease.  Coronary arteries were mildly calcified and diffusely diseased considering his young age.  Normal LV systolic function with mildly elevated left ventricular end-diastolic pressure.  He underwent successful angioplasty and drug-eluting stent placement to the mid left circumflex and mid left anterior descending artery.  Echocardiogram revealed LVEF of 55-60%, no regional wall motion abnormalities, and mild mitral valve regurgitation.  Prior to discharge he was recommended for at least 12 months of dual antiplatelet therapy, aggressive treatment of risk factors, smoking cessation, and cardiac rehab.   She was seen 04/05/22 and was doing well with complaints of chest pressure. A Myoview lexiscan was ordered. It was low risk with no evidence of ischemia or infarct.    The patient was seen on 04/20/2022 and reported persistent chest pain with elevated heart rate, however he  was walking 2 miles at the gym.  Patient was started on Ranexa 500 mg twice daily.  Last seen 07/23/22 and he had not started Toprol. Toprol was re-started and he was referred back to cardiac rehab. He reported occasional chest pain and he was started on Imdur.  Patient was requesting a letter for light duty to give to his job,  this was provided.  Today, the patient reports he is overall doing well. He had to resign from his job because they had no light duty option. 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 He has rare chest pain and shortness of breath. He is tolerating Imdur well. He has to wait on cardiac rehab due to lack of insurance. No lower leg edema, orthopnea or pnd. He needs refills of SL NTG.   Past Medical History:  Diagnosis Date   CAD (coronary artery disease)    History of tobacco use     Past Surgical History:  Procedure Laterality Date   CORONARY STENT INTERVENTION N/A 02/06/2022   Procedure: CORONARY STENT INTERVENTION;  Surgeon: Wellington Hampshire, MD;  Location: [place] [facility];  Service: Cardiovascular;  Laterality: N/A;   LEFT HEART CATH AND CORONARY ANGIOGRAPHY N/A 02/06/2022   Procedure: LEFT HEART CATH AND CORONARY ANGIOGRAPHY;  Surgeon: Wellington Hampshire, MD;  Location: [place] [facility];  Service: Cardiovascular;  Laterality: N/A;    Current Medications: Current Meds  Medication Sig   aspirin EC 81 MG tablet Take 1 tablet (81 mg total) by mouth daily. Swallow whole.   atorvastatin (LIPITOR) 80 MG tablet Take 1 tablet (80 mg total)  by mouth daily.   clopidogrel (PLAVIX) 75 MG tablet Take 1 tablet (75 mg total) by mouth daily.   ezetimibe (ZETIA) 10 MG tablet Take 1 tablet (10 mg total) by mouth daily.   isosorbide mononitrate (IMDUR) 30 MG 24 hr tablet Take 0.5 tablet (15 mg) by mouth once daily   ranolazine (RANEXA) 500 MG 12 hr tablet Take 1 tablet (500 mg total) by mouth 2 (two) times daily.   [DISCONTINUED] nitroGLYCERIN (NITROSTAT) 0.4 MG SL  tablet Place 1 tablet (0.4 mg total) under the tongue every 5 (five) minutes x 3 doses as needed for chest pain.     Allergies:   Patient has no known allergies.   Social History   Socioeconomic History   Marital status: Single    Spouse name: Not on file   Number of children: Not on file   Years of education: Not on file   Highest education level: Not on file  Occupational History   Not on file  Tobacco Use   Smoking status: Former    Packs/day: 0.50    Types: Cigarettes    Quit date: 02/07/2022    Years since quitting: 0.5   Smokeless tobacco: Never   Tobacco comments:    Quit 02/07/22  Vaping Use   Vaping Use: Former  Substance and Sexual Activity   Alcohol use: Not Currently    Comment: last was 01/02/22   Drug use: Never   Sexual activity: Not on file  Other Topics Concern   Not on file  Social History Narrative   Not on file   Social Determinants of Health   Financial Resource Strain: Not on file  Food Insecurity: Not on file  Transportation Needs: Not on file  Physical Activity: Not on file  Stress: Not on file  Social Connections: Not on file     Family History: The patient's family history includes Arthritis/Rheumatoid in his father; Heart attack (age of onset: 86) in his mother; Hyperlipidemia in his father; Renal Disease in his mother.  ROS:   Please see the history of present illness.     All other systems reviewed and are negative.  EKGs/Labs/Other Studies Reviewed:    The following studies were reviewed today:  Myoview lexiscan 04/2022   There is no evidence for ischemia. The study is low risk.   No ST deviation was noted.   LV perfusion is normal.   Left ventricular function is normal visually. calculated LVEF 43%. correlation with echo advised   LAD and RCA calcification/stents noted  Echo 01/2022 1. Left ventricular ejection fraction, by estimation, is 55 to 60%. The  left ventricle has normal function. The left ventricle has no regional   wall motion abnormalities. Left ventricular diastolic parameters were  normal.   2. Right ventricular systolic function is normal. The right ventricular  size is normal. Tricuspid regurgitation signal is inadequate for assessing  PA pressure.   3. The mitral valve is normal in structure. Mild mitral valve  regurgitation. No evidence of mitral stenosis.   4. The aortic valve is normal in structure. Aortic valve regurgitation is  not visualized. No aortic stenosis is present.   5. The inferior vena cava is normal in size with greater than 50%  respiratory variability, suggesting right atrial pressure of 3 mmHg.    LHC 01/2022    Mid RCA lesion is 30% stenosed.   RPDA lesion is 50% stenosed.   Prox LAD to Mid LAD lesion is 40% stenosed.  Mid LAD to Dist LAD lesion is 40% stenosed.   Ost Cx to Prox Cx lesion is 40% stenosed.   3rd Mrg lesion is 50% stenosed.   Mid Cx to Dist Cx lesion is 90% stenosed.   Mid LAD lesion is 80% stenosed.   A drug-eluting stent was successfully placed using a STENT ONYX FRONTIER [id number].   A drug-eluting stent was successfully placed using a STENT ONYX FRONTIER [id number].   Post intervention, there is a 0% residual stenosis.   Post intervention, there is a 0% residual stenosis.   The left ventricular systolic function is normal.   LV end diastolic pressure is mildly elevated.   The left ventricular ejection fraction is 55-65% by visual estimate.   1.  Significant two-vessel coronary artery disease.  The coronary arteries are surprisingly mildly calcified and diffusely diseased considering relatively young age. 2.  Normal LV systolic function and mildly elevated left ventricular end-diastolic pressure. 3.  Successful angioplasty and drug-eluting stent placement to the mid left circumflex and mid left anterior descending artery.   Recommendations: Dual antiplatelet therapy for at least 12 months. Aggressive treatment of risk factors. Smoking cessation and  cardiac rehab.  Coronary Diagrams   Diagnostic Dominance: Right  Intervention         EKG:  EKG is not ordered today.    Recent Labs: 02/01/2022: ALT 39 02/07/2022: BUN 12; Creatinine, Ser 0.85; Hemoglobin 13.8; Platelets 299; Potassium 4.4; Sodium 139  Recent Lipid Panel    Component Value Date/Time   CHOL 122 04/20/2022 0910   TRIG 22 04/20/2022 0910   HDL 38 (L) 04/20/2022 0910   CHOLHDL 3.2 04/20/2022 0910   VLDL 4 04/20/2022 0910   LDLCALC 80 04/20/2022 0910    Physical Exam:    VS:  BP 120/80 (BP Location: Left Arm, Patient Position: Sitting, Cuff Size: Normal)   Pulse 74   Ht '5\' 5"'$  (1.651 m)   Wt 145 lb 9.6 oz (66 kg)   SpO2 99%   BMI 24.23 kg/m     Wt Readings from Last 3 Encounters:  09/10/22 145 lb 9.6 oz (66 kg)  07/23/22 148 lb 4 oz (67.2 kg)  05/18/22 143 lb 3.2 oz (65 kg)     GEN:  Well nourished, well developed in no acute distress HEENT: Normal NECK: No JVD; No carotid bruits LYMPHATICS: No lymphadenopathy CARDIAC: RRR, no murmurs, rubs, gallops RESPIRATORY:  Clear to auscultation without rales, wheezing or rhonchi  ABDOMEN: Soft, non-tender, non-distended MUSCULOSKELETAL:  No edema; No deformity  SKIN: Warm and dry NEUROLOGIC:  Alert and oriented x 3 PSYCHIATRIC:  Normal affect   ASSESSMENT:    1. Coronary artery disease of native artery of native heart with stable angina pectoris ([place])   2. Hyperlipidemia, mixed   3. Tobacco abuse    PLAN:    In order of problems listed above:  CAD s/p DES Lcx and mLAD 01/2022 Unfortunately patient had to resign from his previous job since there was no light duty option.  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  Reports improved chest pain with Imdur.  He still has rare chest pain and shortness of breath.  He reports compliance with DAPT, aspirin and Plavix.  Patient is also taking Ranexa 500 mg twice daily.  Given lack of insurance, he has not started cardiac rehab.  I will refill SL NTG.   Continue aspirin, Plavix, Lipitor, Zetia, Ranexa, Imdur, sublingual nitroglycerin.  HLD LDL 80.  Lifestyle changes encouraged.  Continue Lipitor and Zetia.  Can recheck lipid panel at follow-up.  Tobacco use Patient is no longer smoking.  Disposition: Follow up in 4 month(s) with MD    Signed, Arcelia Pals Ninfa Meeker, PA-C  09/10/2022 9:03 AM    [facility]

## 2022-09-28 ENCOUNTER — Telehealth: Payer: Self-pay

## 2022-09-28 NOTE — Telephone Encounter (Signed)
Tried to call pt to see if he would be interested in Prevail study, NA, did not get an option for voicemail. Sent message to pt via mychart.

## 2023-01-10 ENCOUNTER — Encounter: Payer: Self-pay | Admitting: Cardiovascular Disease

## 2023-01-10 ENCOUNTER — Ambulatory Visit: Payer: Self-pay | Attending: Cardiovascular Disease | Admitting: Cardiovascular Disease

## 2023-01-10 VITALS — BP 106/60 | HR 65 | Ht 65.0 in | Wt 147.2 lb

## 2023-01-10 DIAGNOSIS — I2510 Atherosclerotic heart disease of native coronary artery without angina pectoris: Secondary | ICD-10-CM

## 2023-01-10 DIAGNOSIS — E782 Mixed hyperlipidemia: Secondary | ICD-10-CM

## 2023-01-10 MED ORDER — CLOPIDOGREL BISULFATE 75 MG PO TABS: 75.0000 mg | ORAL_TABLET | Freq: Every day | ORAL | 3 refills | Status: DC

## 2023-01-10 NOTE — Patient Instructions (Signed)
Medication Instructions:  No changes *If you need a refill on your cardiac medications before your next appointment, please call your pharmacy*   Lab Work: None ordered If you have labs (blood work) drawn today and your tests are completely normal, you will receive your results only by: MyChart Message (if you have MyChart) OR A paper copy in the mail If you have any lab test that is abnormal or we need to change your treatment, we will call you to review the results.   Testing/Procedures: None ordered   Follow-Up: At [facility], you and your health needs are our priority.  As part of our continuing mission to provide you with exceptional heart care, we have created designated Provider Care Teams.  These Care Teams include your primary Cardiologist (physician) and Advanced Practice Providers (APPs -  Physician Assistants and Nurse Practitioners) who all work together to provide you with the care you need, when you need it.  We recommend signing up for the patient portal called "MyChart".  Sign up information is provided on this After Visit Summary.  MyChart is used to connect with patients for Virtual Visits (Telemedicine).  Patients are able to view lab/test results, encounter notes, upcoming appointments, etc.  Non-urgent messages can be sent to your provider as well.   To learn more about what you can do with MyChart, go to [email or web address].    Your next appointment:   6 month(s)  Provider:   You may see Muhammad Arida, MD or one of the following Advanced Practice Providers on your designated Care Team:   Christopher Berge, NP Masao Dunn, PA-C Cadence Furth, PA-C Sheri Hammock, NP    

## 2023-01-10 NOTE — Progress Notes (Signed)
Cardiology Office Note   Date:  01/10/2023   ID:  Walter Duncan, DOB [birth year], MRN [id number]  PCP:  Patient, No Pcp Per  Cardiologist:   Lorine Bears, MD   Chief Complaint  Patient presents with   Follow-up    4 month f/u no complaints today. Meds reviewed verbally with pt.      History of Present Illness: Walter Duncan is a 27 y.o. male who presents for a follow-up visit regarding coronary artery disease.  He has known history of tobacco use and hyperlipidemia. He presented to [facility] in August 2023 with non-STEMI.  Cardiac catheterization showed significant two-vessel coronary artery disease with mildly calcified vessels and normal LV systolic function.  He underwent successful PCI and drug-eluting stent placement to the mid left circumflex and mid LAD. He has been doing very well with minimal chest pain.  He denies shortness of breath or palpitations.  He takes his medications regularly.   Past Medical History:  Diagnosis Date   CAD (coronary artery disease)    History of tobacco use     Past Surgical History:  Procedure Laterality Date   CORONARY STENT INTERVENTION N/A 02/06/2022   Procedure: CORONARY STENT INTERVENTION;  Surgeon: Iran Ouch, MD;  Location: [facility];  Service: Cardiovascular;  Laterality: N/A;   LEFT HEART CATH AND CORONARY ANGIOGRAPHY N/A 02/06/2022   Procedure: LEFT HEART CATH AND CORONARY ANGIOGRAPHY;  Surgeon: Iran Ouch, MD;  Location: [facility];  Service: Cardiovascular;  Laterality: N/A;     Current Outpatient Medications  Medication Sig Dispense Refill   aspirin EC 81 MG tablet Take 1 tablet (81 mg total) by mouth daily. Swallow whole. 90 tablet 0   atorvastatin (LIPITOR) 80 MG tablet Take 1 tablet (80 mg total) by mouth daily. 90 tablet 3   ezetimibe (ZETIA) 10 MG tablet Take 1 tablet (10 mg total) by mouth daily. 90 tablet 3   isosorbide mononitrate (IMDUR) 30 MG 24 hr tablet Take 0.5 tablet (15 mg) by  mouth once daily 45 tablet 1   nitroGLYCERIN (NITROSTAT) 0.4 MG SL tablet Place 1 tablet (0.4 mg total) under the tongue every 5 (five) minutes x 3 doses as needed for chest pain. 25 tablet 0   ranolazine (RANEXA) 500 MG 12 hr tablet Take 1 tablet (500 mg total) by mouth 2 (two) times daily. 180 tablet 3   clopidogrel (PLAVIX) 75 MG tablet Take 1 tablet (75 mg total) by mouth daily. 90 tablet 3   No current facility-administered medications for this visit.    Allergies:   Patient has no known allergies.    Social History:  The patient  reports that he quit smoking about 11 months ago. His smoking use included cigarettes. He has never used smokeless tobacco. He reports that he does not currently use alcohol. He reports that he does not use drugs.   Family History:  The patient's family history includes Arthritis/Rheumatoid in his father; Heart attack (age of onset: 76) in his mother; Hyperlipidemia in his father; Renal Disease in his mother.    ROS:  Please see the history of present illness.   Otherwise, review of systems are positive for none.   All other systems are reviewed and negative.    PHYSICAL EXAM: VS:  BP 106/60 (BP Location: Left Arm, Patient Position: Sitting, Cuff Size: Normal)   Pulse 65   Ht 5\' 5"  (1.651 m)   Wt 147 lb 4 oz (  66.8 kg)   SpO2 98%   BMI 24.50 kg/m  , BMI Body mass index is 24.5 kg/m. GEN: Well nourished, well developed, in no acute distress  HEENT: normal  Neck: no JVD, carotid bruits, or masses Cardiac: RRR; no murmurs, rubs, or gallops,no edema  Respiratory:  clear to auscultation bilaterally, normal work of breathing GI: soft, nontender, nondistended, + BS MS: no deformity or atrophy  Skin: warm and dry, no rash Neuro:  Strength and sensation are intact Psych: euthymic mood, full affect   EKG:  EKG is ordered today. The ekg ordered today demonstrates : Normal sinus rhythm Normal ECG When compared with ECG of 07-Feb-2022 05:09, No  significant change was found    Recent Labs: 02/01/2022: ALT 39 02/07/2022: BUN 12; Creatinine, Ser 0.85; Hemoglobin 13.8; Platelets 299; Potassium 4.4; Sodium 139    Lipid Panel    Component Value Date/Time   CHOL 122 04/20/2022 0910   TRIG 22 04/20/2022 0910   HDL 38 (L) 04/20/2022 0910   CHOLHDL 3.2 04/20/2022 0910   VLDL 4 04/20/2022 0910   LDLCALC 80 04/20/2022 0910      Wt Readings from Last 3 Encounters:  01/10/23 147 lb 4 oz (66.8 kg)  09/10/22 145 lb 9.6 oz (66 kg)  07/23/22 148 lb 4 oz (67.2 kg)           No data to display            ASSESSMENT AND PLAN:  1.  Coronary artery disease involving native coronary arteries without angina: He is doing well overall.  I favor continued dual antiplatelet therapy for now but will consider stopping clopidogrel in 6 months.  2.  Hyperlipidemia: Continue high-dose atorvastatin and ezetimibe.  Most recent LDL was 80 which was significantly improved from before and I suspect that it is likely even better now.  3.  Previous tobacco use: He quit smoking with no relapse.    Disposition:   FU with me in 6 months  Signed,  Lorine Bears, MD  01/10/2023 9:16 AM    [facility]

## 2023-04-09 ENCOUNTER — Other Ambulatory Visit: Payer: Self-pay | Admitting: Cardiology

## 2023-04-28 ENCOUNTER — Other Ambulatory Visit: Payer: Self-pay | Admitting: Cardiology

## 2023-11-29 ENCOUNTER — Ambulatory Visit: Admitting: Cardiovascular Disease

## 2023-11-29 ENCOUNTER — Encounter: Payer: Self-pay | Admitting: Nurse Practitioner

## 2023-11-29 ENCOUNTER — Ambulatory Visit: Payer: Self-pay | Attending: Nurse Practitioner | Admitting: Nurse Practitioner

## 2023-11-29 VITALS — BP 120/72 | HR 55 | Ht 65.0 in | Wt 154.6 lb

## 2023-11-29 DIAGNOSIS — I2510 Atherosclerotic heart disease of native coronary artery without angina pectoris: Secondary | ICD-10-CM | POA: Diagnosis not present

## 2023-11-29 DIAGNOSIS — Z91199 Patient's noncompliance with other medical treatment and regimen due to unspecified reason: Secondary | ICD-10-CM

## 2023-11-29 DIAGNOSIS — E785 Hyperlipidemia, unspecified: Secondary | ICD-10-CM

## 2023-11-29 MED ORDER — ATORVASTATIN CALCIUM 80 MG PO TABS: 80.0000 mg | ORAL_TABLET | Freq: Every day | ORAL | 3 refills | Status: AC

## 2023-11-29 MED ORDER — EZETIMIBE 10 MG PO TABS: 10.0000 mg | ORAL_TABLET | Freq: Every day | ORAL | 3 refills | Status: AC

## 2023-11-29 MED ORDER — NITROGLYCERIN 0.4 MG SL SUBL: 0.4000 mg | SUBLINGUAL_TABLET | SUBLINGUAL | 3 refills | Status: AC | PRN

## 2023-11-29 NOTE — Patient Instructions (Signed)
 Medication Instructions:  Your Physician recommend you continue on your current medication as directed.    *If you need a refill on your cardiac medications before your next appointment, please call your pharmacy*  Lab Work: Your provider would like for you to return in 6 WEEKS to have the following labs drawn: CMP, LIPID PANEL, CBC.   Please go to [facility] [address] ([facility] [address], [place] [address] You do not need an appointment.  They are open from 8 am- 4:30 pm.  Lunch from 1:00 pm- 2:00 pm You DO need to be fasting.  If you have labs (blood work) drawn today and your tests are completely normal, you will receive your results only by: MyChart Message (if you have MyChart) OR A paper copy in the mail If you have any lab test that is abnormal or we need to change your treatment, we will call you to review the results.  Testing/Procedures: No test ordered today   Follow-Up: At [facility], you and your health needs are our priority.  As part of our continuing mission to provide you with exceptional heart care, our providers are all part of one team.  This team includes your primary Cardiologist (physician) and Advanced Practice Providers or APPs (Physician Assistants and Nurse Practitioners) who all work together to provide you with the care you need, when you need it.  Your next appointment:   6 month(s)  Provider:   You may see Antionette Kirks, MD or Laneta Pintos, NP We recommend signing up for the patient portal called "MyChart".  Sign up information is provided on this After Visit Summary.  MyChart is used to connect with patients for Virtual Visits (Telemedicine).  Patients are able to view lab/test results, encounter notes, upcoming appointments, etc.  Non-urgent messages can be sent to your provider as well.   To learn more about what you can do with MyChart, go to [email or web address].

## 2023-11-29 NOTE — Progress Notes (Signed)
 Office Visit    Patient Name: Walter Duncan Date of Encounter: 11/29/2023  Primary Care Provider:  Patient, No Pcp Per Primary Cardiologist:  Antionette Kirks, MD  Chief Complaint    28 y.o. male with a history of CAD status post non-STEMI with LAD and circumflex stenting in August 2023, hyperlipidemia, and prior tobacco abuse, who presents for CAD follow-up.  Past Medical History   Subjective   Past Medical History:  Diagnosis Date   CAD (coronary artery disease)    a. 01/2022 NSTEMI/PCI: LM min irregs, LAD 40p, 52m (2.75x18 Onyx Frontier DES), 52m/d, D1 min irregs, LCX 40ost, 44m/d (2.25x18 Onyx Frontier DES), OM3 50, RCA 61m, RPDA 50   History of echocardiogram    a. 01/2022 Echo: EF 55-60%, no rwma, nl RV size/fxn, mild MR.   History of tobacco use    Hyperlipidemia LDL goal <70    Past Surgical History:  Procedure Laterality Date   CORONARY STENT INTERVENTION N/A 02/06/2022   Procedure: CORONARY STENT INTERVENTION;  Surgeon: Wenona Hamilton, MD;  Location: [facility] INV[facility]IVE CV [facility];  Service: Cardiovascular;  Laterality: N/A;   LEFT HEART CATH AND CORONARY ANGIOGRAPHY N/A 02/06/2022   Procedure: LEFT HEART CATH AND CORONARY ANGIOGRAPHY;  Surgeon: Wenona Hamilton, MD;  Location: [facility] INVASIVE CV LAB;  Service: Cardiovascular;  Laterality: N/A;   Allergies  No Known Allergies     History of Present Illness      28 y.o. y/o male with a history of CAD, hyperlipidemia, and prior tobacco abuse.  In August 2023, patient presented to the emergency department with chest pain and ruled in for non-STEMI.  He underwent diagnostic catheterization which showed severe LAD and circumflex disease, as well as moderate RCA disease.  The LAD and circumflex was successfully treated with drug-eluting stents.  Echocardiogram during admission showed normal LV function with mild MR.  In the setting of chest discomfort, he underwent stress testing October 2023 which was low risk without evidence of  ischemia or infarct.  He was placed on Ranexa .   Walter Duncan was last seen in cardiology clinic in July 2024, at which time he was doing well.  He says that over the past 10 months, he has continued to do well.  He remains active, exercising several days a week, doing some aerobic work on the treadmill and also lifting weights (primarily reps).  He does not experience chest pain or dyspnea and denies palpitations, PND, orthopnea, dizziness, syncope, edema, or early satiety.  He has been off of all of his medications for several months.  Objective   Home Medications    Current Outpatient Medications  Medication Sig Dispense Refill   aspirin  EC 81 MG tablet Take 1 tablet (81 mg total) by mouth daily. Swallow whole. 90 tablet 0   atorvastatin  (LIPITOR ) 80 MG tablet Take 1 tablet (80 mg total) by mouth daily. 90 tablet 3   ezetimibe  (ZETIA ) 10 MG tablet Take 1 tablet (10 mg total) by mouth daily. 90 tablet 3   nitroGLYCERIN  (NITROSTAT ) 0.4 MG SL tablet Place 1 tablet (0.4 mg total) under the tongue every 5 (five) minutes x 3 doses as needed for chest pain. 25 tablet 3   No current facility-administered medications for this visit.     Physical Exam    VS:  BP 120/72   Pulse (!) 55   Ht 5\' 5"  (1.651 m)   Wt 154 lb 9.6 oz (70.1 kg)   SpO2 98%  BMI 25.73 kg/m  , BMI Body mass index is 25.73 kg/m.       GEN: Well nourished, well developed, in no acute distress. HEENT: normal. Neck: Supple, no JVD, carotid bruits, or masses. Cardiac: RRR, no murmurs, rubs, or gallops. No clubbing, cyanosis, edema.  Radials 2+/PT 2+ and equal bilaterally.  Respiratory:  Respirations regular and unlabored, clear to auscultation bilaterally. GI: Soft, nontender, nondistended, BS + x 4. MS: no deformity or atrophy. Skin: warm and dry, no rash. Neuro:  Strength and sensation are intact. Psych: Normal affect.  Accessory Clinical Findings    ECG personally reviewed by me today - EKG  Interpretation Date/Time:  Friday Nov 29 2023 10:55:12 EDT Ventricular Rate:  55 PR Interval:  122 QRS Duration:  100 QT Interval:  406 QTC Calculation: 388 R Axis:   15  Text Interpretation: Sinus bradycardia Confirmed by Laneta Pintos [phone]) on 11/29/2023 10:59:02 AM  - no acute changes.  Lab Results  Component Value Date   WBC 7.8 02/07/2022   HGB 13.8 02/07/2022   HCT 40.5 02/07/2022   MCV 87.7 02/07/2022   PLT 299 02/07/2022   Lab Results  Component Value Date   CREATININE 0.85 02/07/2022   BUN 12 02/07/2022   NA 139 02/07/2022   K 4.4 02/07/2022   CL 107 02/07/2022   CO2 26 02/07/2022   Lab Results  Component Value Date   ALT 39 02/01/2022   AST 32 02/01/2022   ALKPHOS 65 02/01/2022   BILITOT 0.7 02/01/2022   Lab Results  Component Value Date   CHOL 122 04/20/2022   HDL 38 (L) 04/20/2022   LDLCALC 80 04/20/2022   TRIG 22 04/20/2022   CHOLHDL 3.2 04/20/2022     Lab Results  Component Value Date   TSH 1.32 09/13/2011       Assessment & Plan    1.  Coronary artery disease: Status post prior non-STEMI with LAD and circumflex stenting in August 2023.  He completed 12 months of dual antiplatelet therapy and is now on aspirin  81 mg daily.  Unfortunately, he stopped taking his atorvastatin  and Zetia  as well as his nitrate and Ranexa .  As he has not been having any chest pain, we will forego resumption of the latter 2 medications but I have refilled his atorvastatin  and Zetia  today with plan for lipids and LFTs in 6 weeks.  I stressed the importance of medication compliance.  2.  Hyperlipidemia: He has been off of statin and Zetia  for at least 6 months.  I resumed atorvastatin  and Zetia  today with plan for follow-up lipids and LFTs in 6 weeks.  3.  Noncompliance: Off of all of his medications except for aspirin  for at least 6 months.  Stressed the importance of compliance refilled statin and Zetia  today.  4.  Remote tobacco abuse: Has not smoked since his  myocardial infarction in 2023.  Congratulated him on this.  5.  Disposition: Follow-up lipids and LFTs in 6 weeks.  Follow-up in clinic in 6 months or sooner if necessary.  Laneta Pintos, NP 11/29/2023, 4:18 PM

## 2024-01-06 ENCOUNTER — Ambulatory Visit: Payer: Self-pay | Admitting: Nurse Practitioner

## 2024-01-06 ENCOUNTER — Other Ambulatory Visit
Admission: RE | Admit: 2024-01-06 | Discharge: 2024-01-06 | Disposition: A | Discharge status: Home / Self Care | Source: Ambulatory Visit | Attending: Nurse Practitioner | Admitting: Nurse Practitioner

## 2024-01-06 DIAGNOSIS — E785 Hyperlipidemia, unspecified: Secondary | ICD-10-CM | POA: Insufficient documentation

## 2024-01-06 DIAGNOSIS — I2510 Atherosclerotic heart disease of native coronary artery without angina pectoris: Secondary | ICD-10-CM | POA: Diagnosis present

## 2024-01-06 DIAGNOSIS — I25118 Atherosclerotic heart disease of native coronary artery with other forms of angina pectoris: Secondary | ICD-10-CM

## 2024-01-06 LAB — LIPID PANEL
Cholesterol: 113 mg/dL (ref 0–200)
HDL: 32 mg/dL — ABNORMAL LOW (ref >40)
LDL Cholesterol: 69 mg/dL (ref 0–99)
Total CHOL/HDL Ratio: 3.5 ratio
Triglycerides: 61 mg/dL (ref <150)
VLDL: 12 mg/dL (ref 0–40)

## 2024-01-06 LAB — COMPREHENSIVE METABOLIC PANEL WITH GFR
ALT: 49 U/L — ABNORMAL HIGH (ref 0–44)
AST: 27 U/L (ref 15–41)
Albumin: 4.1 g/dL (ref 3.5–5.0)
Alkaline Phosphatase: 48 U/L (ref 38–126)
Anion gap: 9 (ref 5–15)
BUN: 11 mg/dL (ref 6–20)
CO2: 23 mmol/L (ref 22–32)
Calcium: 9.3 mg/dL (ref 8.9–10.3)
Chloride: 108 mmol/L (ref 98–111)
Creatinine, Ser: 0.72 mg/dL (ref 0.61–1.24)
GFR, Estimated: >60 mL/min (ref >60)
Glucose, Bld: 106 mg/dL — ABNORMAL HIGH (ref 70–99)
Potassium: 4 mmol/L (ref 3.5–5.1)
Sodium: 140 mmol/L (ref 135–145)
Total Bilirubin: 0.7 mg/dL (ref 0.0–1.2)
Total Protein: 6.6 g/dL (ref 6.5–8.1)

## 2024-01-06 LAB — CBC
HCT: 43.4 % (ref 39.0–52.0)
Hemoglobin: 14.9 g/dL (ref 13.0–17.0)
MCH: 29.3 pg (ref 26.0–34.0)
MCHC: 34.3 g/dL (ref 30.0–36.0)
MCV: 85.4 fL (ref 80.0–100.0)
Platelets: 250 K/uL (ref 150–400)
RBC: 5.08 MIL/uL (ref 4.22–5.81)
RDW: 12 % (ref 11.5–15.5)
WBC: 7.2 K/uL (ref 4.0–10.5)
nRBC: 0 % (ref 0.0–0.2)

## 2024-01-06 NOTE — Addendum Note (Signed)
 Addended by: ALEX SLOUGH C on: 01/06/2024 09:21 AM   Modules accepted: Orders

## 2024-01-06 NOTE — Addendum Note (Signed)
 Addended by: ALEX SLOUGH C on: 01/06/2024 09:22 AM   Modules accepted: Orders

## 2024-05-26 NOTE — Progress Notes (Unsigned)
 Cardiology Office Note:    Date:  05/27/2024   ID:  Walter Duncan, DOB [birth year], MRN [id number]  PCP:  Patient, No Pcp Per   [facility] Providers Cardiologist:  Deatrice Cage, MD     Referring MD: No ref. provider found   Chief complaint: 48-month follow-up     History of Present Illness:   Walter Duncan is a 28 y.o. male with a hx of CAD status post non-STEMI with LAD and circumflex stenting in August 2023, hyperlipidemia, and prior tobacco abuse, who presents for CAD follow-up.   01/2022 presented to ED with CP/NSTEMI.  LHC w/ severe LAD and LCx disease, moderate RCA disease.  LAD and LCx treated with DES.  Echo showing normal LV function with mild MR.  Stress testing in 04/2022 2/2 CP, low risk without evidence of ischemia or infarct, started on Ranexa .  Condition had improved at 12/2022 follow-up. At most recent follow-up on 11/29/2023, patient asymptomatic, exercising, had stopped all meds for several months.    Presents independently, doing well from a cardiac standpoint. He denies chest pain, palpitations, dyspnea, orthopnea, n, v,  dark/tarry/bloody stools, hematuria, dizziness, syncope, edema, weight gain. He goes to the gym 3-4 days a week, does cardio and weight lifting. Denies smoking, vaping, drug use. Reports occasional social alcohol use. Works at [employer]. Has not been taking Zetia , has not needed nitroglycerin .    ROS:   Please see the history of present illness.    All other systems reviewed and are negative.     Past Medical History:  Diagnosis Date   CAD (coronary artery disease)    a. 01/2022 NSTEMI/PCI: LM min irregs, LAD 40p, 70m (2.75x18 Onyx Frontier DES), 14m/d, D1 min irregs, LCX 40ost, 23m/d (2.25x18 Onyx Frontier DES), OM3 50, RCA 57m, RPDA 50   History of echocardiogram    a. 01/2022 Echo: EF 55-60%, no rwma, nl RV size/fxn, mild MR.   History of tobacco use    Hyperlipidemia LDL goal <70     Past Surgical History:  Procedure  Laterality Date   CORONARY STENT INTERVENTION N/A 02/06/2022   Procedure: CORONARY STENT INTERVENTION;  Surgeon: Cage Deatrice LABOR, MD;  Location: [facility] CV [facility];  Service: Cardiovascular;  Laterality: N/A;   LEFT HEART CATH AND CORONARY ANGIOGRAPHY N/A 02/06/2022   Procedure: LEFT HEART CATH AND CORONARY ANGIOGRAPHY;  Surgeon: Cage Deatrice LABOR, MD;  Location: [facility];  Service: Cardiovascular;  Laterality: N/A;    Current Medications: Current Meds  Medication Sig   aspirin  EC 81 MG tablet Take 1 tablet (81 mg total) by mouth daily. Swallow whole.   atorvastatin  (LIPITOR ) 80 MG tablet Take 1 tablet (80 mg total) by mouth daily.   ezetimibe  (ZETIA ) 10 MG tablet Take 1 tablet (10 mg total) by mouth daily.   nitroGLYCERIN  (NITROSTAT ) 0.4 MG SL tablet Place 1 tablet (0.4 mg total) under the tongue every 5 (five) minutes x 3 doses as needed for chest pain.     Allergies:   Patient has no known allergies.   Social History   Socioeconomic History   Marital status: Single    Spouse name: Not on file   Number of children: Not on file   Years of education: Not on file   Highest education level: Not on file  Occupational History   Not on file  Tobacco Use   Smoking status: Former    Current packs/day: 0.00    Types: Cigarettes  Quit date: 02/07/2022    Years since quitting: 2.3   Smokeless tobacco: Never   Tobacco comments:    Quit 02/07/22  Vaping Use   Vaping status: Former  Substance and Sexual Activity   Alcohol use: Not Currently    Comment: last was 01/02/22   Drug use: Never   Sexual activity: Not on file  Other Topics Concern   Not on file  Social History Narrative   Not on file   Social Drivers of Health   Financial Resource Strain: Not on file  Food Insecurity: Not on file  Transportation Needs: Not on file  Physical Activity: Not on file  Stress: Not on file  Social Connections: Unknown (11/14/2021)   Received from [employer]   Social Network     Social Network: Not on file     Family History: The patient's family history includes Arthritis/Rheumatoid in his father; Heart attack (age of onset: 72) in his mother; Hyperlipidemia in his father; Renal Disease in his mother.  EKGs/Labs/Other Studies Reviewed:    The following studies were reviewed today:       Recent Labs: 01/06/2024: ALT 49; BUN 11; Creatinine, Ser 0.72; Hemoglobin 14.9; Platelets 250; Potassium 4.0; Sodium 140  Recent Lipid Panel    Component Value Date/Time   CHOL 113 01/06/2024 0922   TRIG 61 01/06/2024 0922   HDL 32 (L) 01/06/2024 0922   CHOLHDL 3.5 01/06/2024 0922   VLDL 12 01/06/2024 0922   LDLCALC 69 01/06/2024 0922      Physical Exam:    VS:  BP 108/64   Pulse (!) 58   Ht 5' 5 (1.651 m)   Wt 151 lb 6.4 oz (68.7 kg)   SpO2 99%   BMI 25.19 kg/m        Wt Readings from Last 3 Encounters:  05/27/24 151 lb 6.4 oz (68.7 kg)  11/29/23 154 lb 9.6 oz (70.1 kg)  01/10/23 147 lb 4 oz (66.8 kg)     GEN:  Well nourished, well developed in no acute distress HEENT: Normal NECK: No JVD; No carotid bruits CARDIAC:  S1-S2 normal, RRR, no murmurs, rubs, gallops RESPIRATORY:  Clear to auscultation without rales, wheezing or rhonchi  MUSCULOSKELETAL:  No edema; No deformity  SKIN: Warm and dry NEUROLOGIC:  Alert and oriented x 3 PSYCHIATRIC:  Normal affect       Assessment & Plan Coronary artery disease involving native coronary artery of native heart, unspecified whether angina present LHC 01/2022: Significant 2 vessel disease, DES to [facility] and mLAD Denies CP, SOB, near syncope, or palp Works out multiple times a week without symptoms Has been compliant with aspirin  and atorvastatin  Continue ASA 81 mg daily Continue atorvastatin  80 mg daily Hyperlipidemia LDL goal <70 Lipid panel 01/06/2024: cholesterol 113, HDL 32, LDL 69, Trig 61 Reports he's not been taking his zetia  Will order LFTs for repeat today, if normal will recommend he resume his  zetia  Continue atorvastatin  80 mg daily  Follow up in 1 year or sooner if necessary          Medication Adjustments/Labs and Tests Ordered: Current medicines are reviewed at length with the patient today.  Concerns regarding medicines are outlined above.  Orders Placed This Encounter  Procedures   Hepatic function panel   No orders of the defined types were placed in this encounter.   Patient Instructions  Medication Instructions:  Your physician recommends that you continue on your current medications as directed. Please refer to  the Current Medication list given to you today.  *If you need a refill on your cardiac medications before your next appointment, please call your pharmacy*  Lab Work: TODAY: LFTs If you have labs (blood work) drawn today and your tests are completely normal, you will receive your results only by: MyChart Message (if you have MyChart) OR A paper copy in the mail If you have any lab test that is abnormal or we need to change your treatment, we will call you to review the results.  Testing/Procedures: NONE  Follow-Up: At [facility], you and your health needs are our priority.  As part of our continuing mission to provide you with exceptional heart care, our providers are all part of one team.  This team includes your primary Cardiologist (physician) and Advanced Practice Providers or APPs (Physician Assistants and Nurse Practitioners) who all work together to provide you with the care you need, when you need it.  Your next appointment:   1 year(s)  Provider:   Deatrice Cage, MD or Lonni Meager, NP             Signed, Miriam FORBES Shams, NP  05/27/2024 11:23 AM    [facility]

## 2024-05-27 ENCOUNTER — Encounter: Payer: Self-pay | Admitting: Nurse Practitioner

## 2024-05-27 ENCOUNTER — Ambulatory Visit: Attending: Nurse Practitioner | Admitting: Emergency Medicine

## 2024-05-27 VITALS — BP 108/64 | HR 58 | Ht 65.0 in | Wt 151.4 lb

## 2024-05-27 DIAGNOSIS — E785 Hyperlipidemia, unspecified: Secondary | ICD-10-CM | POA: Diagnosis not present

## 2024-05-27 DIAGNOSIS — I2510 Atherosclerotic heart disease of native coronary artery without angina pectoris: Secondary | ICD-10-CM

## 2024-05-27 NOTE — Patient Instructions (Signed)
 Medication Instructions:  Your physician recommends that you continue on your current medications as directed. Please refer to the Current Medication list given to you today.  *If you need a refill on your cardiac medications before your next appointment, please call your pharmacy*  Lab Work: TODAY: LFTs If you have labs (blood work) drawn today and your tests are completely normal, you will receive your results only by: MyChart Message (if you have MyChart) OR A paper copy in the mail If you have any lab test that is abnormal or we need to change your treatment, we will call you to review the results.  Testing/Procedures: NONE  Follow-Up: At [facility], you and your health needs are our priority.  As part of our continuing mission to provide you with exceptional heart care, our providers are all part of one team.  This team includes your primary Cardiologist (physician) and Advanced Practice Providers or APPs (Physician Assistants and Nurse Practitioners) who all work together to provide you with the care you need, when you need it.  Your next appointment:   1 year(s)  Provider:   Deatrice Cage, MD or Lonni Meager, NP

## 2024-05-28 ENCOUNTER — Ambulatory Visit: Payer: Self-pay | Admitting: Emergency Medicine

## 2024-05-28 LAB — HEPATIC FUNCTION PANEL
ALT: 46 IU/L — ABNORMAL HIGH (ref 0–44)
AST: 22 IU/L (ref 0–40)
Albumin: 4.4 g/dL (ref 4.3–5.2)
Alkaline Phosphatase: 66 IU/L (ref 47–123)
Bilirubin Total: 0.5 mg/dL (ref 0.0–1.2)
Bilirubin, Direct: 0.14 mg/dL (ref 0.00–0.40)
Total Protein: 6.7 g/dL (ref 6.0–8.5)
